# Patient Record
Sex: Female | Born: 1990 | Hispanic: Yes | Marital: Married | Smoking: Never smoker
Health system: Southern US, Community
[De-identification: ages and names within clinical notes are randomized; demographics above are authoritative.]

## PROBLEM LIST (undated history)

## (undated) DIAGNOSIS — Z202 Contact with and (suspected) exposure to infections with a predominantly sexual mode of transmission: Secondary | ICD-10-CM

## (undated) HISTORY — DX: Contact with and (suspected) exposure to infections with a predominantly sexual mode of transmission: Z20.2

---
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
## 2012-07-07 ENCOUNTER — Telehealth: Payer: Self-pay | Admitting: Certified Nurse Midwife

## 2012-07-07 NOTE — Telephone Encounter (Signed)
Pt wants to come in for STD testing and she has not had a cycle

## 2012-07-08 ENCOUNTER — Encounter: Payer: Self-pay | Admitting: Certified Nurse Midwife

## 2012-07-08 NOTE — Telephone Encounter (Signed)
Spoke with pt who would like STD testing, and also wants to discuss no menses since stopping Depo 6 months ago. No current BC. Sched OV with DL 05-16-50 at [id number] per pt request.  aa

## 2012-07-13 ENCOUNTER — Ambulatory Visit (INDEPENDENT_AMBULATORY_CARE_PROVIDER_SITE_OTHER): Payer: BC Managed Care – PPO | Admitting: Certified Nurse Midwife

## 2012-07-13 ENCOUNTER — Encounter: Payer: Self-pay | Admitting: Certified Nurse Midwife

## 2012-07-13 VITALS — BP 90/60 | HR 68 | Resp 16 | Ht <= 58 in | Wt 119.0 lb

## 2012-07-13 DIAGNOSIS — Z113 Encounter for screening for infections with a predominantly sexual mode of transmission: Secondary | ICD-10-CM

## 2012-07-13 DIAGNOSIS — N912 Amenorrhea, unspecified: Secondary | ICD-10-CM

## 2012-07-13 NOTE — Progress Notes (Signed)
  22 y.o.Single Hispanic female presents with no menses since April 05 2012  Currently periods were occurring every month  for  three days.   Bleeding is light.  Periods were not regular in the past, occurring every  3 to 4  months. Patient reports no other health issues . Reports Ortho-Evra, compliant with using but stopped in December.  Has not been sexually active since 11-2011. Feels like period would start but no bleeding.  Worried. Also would like to have STD screening from last partner. Urine pregnancy not done.  Patient denies having been diagnosed with metabolic syndrome or polycystic ovaries.Last aex 01-08-12 normal here.  Mother with patient today for support and questions.  Pertinent items noted in HPI  O: Healthy female WD WN    Weight 119 lb  Height 4 ' Affect: normal orientation x 3 Exam:Thyroid no nodules or enlargement  Abdomen: soft non-tender no masses Lymph: groin no enlargement External genital area: no lesions normal BUS: negative Urethra: non tender Vagina: normal appearance, normal discharge Cervix: Non tender CMT negative Specimens obtained Uterus: normal size mid position, non tender Adnexa: normal, no masses, non tender Perineum: no lesions   A: Amenorrhea  History of irregular cycles Previous history of Ortho Evra use for cycle regulation without problems Normal exam STD screening desired  Plan: Discussed with patient factors that may be contributory to menstrual abnormalities include recent stopping of Ortho Evra, Pituitary or Thyroid changes. Previous treatments for menstrual abnormalities include Provera challenge followed by contraception for cycle regulation. Also using Provera challenge and awaiting normal cycle after use, if all labs are normal. Labs:TSH,Prolactin,HCG Qualatative Questions addressed GC,Chlamydia,RPR,HIV, discussed prevention  RV as indicated      Reviewed, TL

## 2012-07-14 LAB — HIV ANTIBODY (ROUTINE TESTING W REFLEX): HIV: NONREACTIVE

## 2012-07-14 LAB — RPR

## 2012-07-14 LAB — HCG, SERUM, QUALITATIVE: Preg, Serum: NEGATIVE

## 2012-07-15 NOTE — Addendum Note (Signed)
Addended by: Verner Chol on: 07/15/2012 03:27 PM   Modules accepted: Orders

## 2013-01-10 ENCOUNTER — Ambulatory Visit: Payer: Self-pay | Admitting: Certified Nurse Midwife

## 2013-01-10 ENCOUNTER — Encounter: Payer: Self-pay | Admitting: Certified Nurse Midwife

## 2013-11-24 ENCOUNTER — Encounter: Payer: Self-pay | Admitting: Certified Nurse Midwife

## 2013-11-24 ENCOUNTER — Ambulatory Visit (INDEPENDENT_AMBULATORY_CARE_PROVIDER_SITE_OTHER): Payer: BC Managed Care – PPO | Admitting: Certified Nurse Midwife

## 2013-11-24 VITALS — BP 110/72 | HR 68 | Resp 16 | Ht 58.75 in | Wt 115.0 lb

## 2013-11-24 DIAGNOSIS — Z01419 Encounter for gynecological examination (general) (routine) without abnormal findings: Secondary | ICD-10-CM

## 2013-11-24 DIAGNOSIS — Z0000 Encounter for general adult medical examination without abnormal findings: Secondary | ICD-10-CM

## 2013-11-24 DIAGNOSIS — Z124 Encounter for screening for malignant neoplasm of cervix: Secondary | ICD-10-CM

## 2013-11-24 LAB — HEMOGLOBIN, FINGERSTICK: HEMOGLOBIN, FINGERSTICK: 12.1 g/dL (ref 12.0–16.0)

## 2013-11-24 NOTE — Progress Notes (Signed)
23 y.o. G0P0000 Single Hispanic Fe here for annual exam. Periods normal, no issues. Contraception working well. Desires STD screening. Uses urgent care if needed. Recent trip to [place] for vacation! No health issues today.  Patient's last menstrual period was 11/18/2013.          Sexually active: Yes.    The current method of family planning is condoms most of the time.    Exercising: No.  exercise Smoker:  no  Health Maintenance: Pap: none MMG:  none Colonoscopy:  none BMD:   none TDaP:  2011 Labs: Hgb-12.1 Self breast exam: not done   reports that she has never smoked. She does not have any smokeless tobacco history on file. She reports that she drinks about .5 ounces of alcohol per week. She reports that she does not use illicit drugs.  Past Medical History  Diagnosis Date  . Chlamydia contact, treated     7/11, 10/11    History reviewed. No pertinent past surgical history.  Current Outpatient Prescriptions  Medication Sig Dispense Refill  . trimethoprim-polymyxin b (POLYTRIM) ophthalmic solution as needed.       No current facility-administered medications for this visit.    Family History  Problem Relation Age of Onset  . Diabetes Maternal Grandmother     ROS:  Pertinent items are noted in HPI.  Otherwise, a comprehensive ROS was negative.  Exam:   BP 110/72  Pulse 68  Resp 16  Ht 4' 10.75" (1.492 m)  Wt 115 lb (52.164 kg)  BMI 23.43 kg/m2  LMP 11/18/2013 Height: 4' 10.75" (149.2 cm)  Ht Readings from Last 3 Encounters:  11/24/13 4' 10.75" (1.492 m)  07/13/12 4' (1.219 m)    General appearance: alert, cooperative and appears stated age Head: Normocephalic, without obvious abnormality, atraumatic Neck: no adenopathy, supple, symmetrical, trachea midline and thyroid normal to inspection and palpation Lungs: clear to auscultation bilaterally Breasts: normal appearance, no masses or tenderness, No nipple retraction or dimpling, No nipple discharge or  bleeding, No axillary or supraclavicular adenopathy Heart: regular rate and rhythm Abdomen: soft, non-tender; no masses,  no organomegaly Extremities: extremities normal, atraumatic, no cyanosis or edema Skin: Skin color, texture, turgor normal. No rashes or lesions Lymph nodes: Cervical, supraclavicular, and axillary nodes normal. No abnormal inguinal nodes palpated Neurologic: Grossly normal   Pelvic: External genitalia:  no lesions              Urethra:  normal appearing urethra with no masses, tenderness or lesions              Bartholin's and Skene's: normal                 Vagina: normal appearing vagina with normal color and discharge, no lesions              Cervix: non tender, normal,no lesions              Pap taken: Yes.   Bimanual Exam:  Uterus:  normal size, contour, position, consistency, mobility, non-tender and anteverted              Adnexa: normal adnexa and no mass, fullness, tenderness               Rectovaginal: Confirms               Anus:  normal appearance  A:  Well Woman with normal exam  Contraception condoms  STD screening  P:   Reviewed health and wellness  pertinent to exam  Discussed importance of consistent use  Lab:GC,Chlamydia, HIV,RPR  Pap smear taken today with HPV reflex   counseled on breast self exam, STD prevention, HIV risk factors and prevention, adequate intake of calcium and vitamin D, diet and exercise  return annually or prn  An After Visit Summary was printed and given to the patient.

## 2013-11-24 NOTE — Patient Instructions (Signed)
General topics  Next pap or exam is  due in 1 year Take a Women's multivitamin Take 1200 mg. of calcium daily - prefer dietary If any concerns in interim to call back  Breast Self-Awareness Practicing breast self-awareness may pick up problems early, prevent significant medical complications, and possibly save your life. By practicing breast self-awareness, you can become familiar with how your breasts look and feel and if your breasts are changing. This allows you to notice changes early. It can also offer you some reassurance that your breast health is good. One way to learn what is normal for your breasts and whether your breasts are changing is to do a breast self-exam. If you find a lump or something that was not present in the past, it is best to contact your caregiver right away. Other findings that should be evaluated by your caregiver include nipple discharge, especially if it is bloody; skin changes or reddening; areas where the skin seems to be pulled in (retracted); or new lumps and bumps. Breast pain is seldom associated with cancer (malignancy), but should also be evaluated by a caregiver. BREAST SELF-EXAM The best time to examine your breasts is 5 7 days after your menstrual period is over.  ExitCare Patient Information 2013 [employer].   Exercise to Stay Healthy Exercise helps you become and stay healthy. EXERCISE IDEAS AND TIPS Choose exercises that:  You enjoy.  Fit into your day. You do not need to exercise really hard to be healthy. You can do exercises at a slow or medium level and stay healthy. You can:  Stretch before and after working out.  Try yoga, Pilates, or tai chi.  Lift weights.  Walk fast, swim, jog, run, climb stairs, bicycle, dance, or rollerskate.  Take aerobic classes. Exercises that burn about 150 calories:  Running 1  miles in 15 minutes.  Playing volleyball for 45 to 60 minutes.  Washing and waxing a car for 45 to 60  minutes.  Playing touch football for 45 minutes.  Walking 1  miles in 35 minutes.  Pushing a stroller 1  miles in 30 minutes.  Playing basketball for 30 minutes.  Raking leaves for 30 minutes.  Bicycling 5 miles in 30 minutes.  Walking 2 miles in 30 minutes.  Dancing for 30 minutes.  Shoveling snow for 15 minutes.  Swimming laps for 20 minutes.  Walking up stairs for 15 minutes.  Bicycling 4 miles in 15 minutes.  Gardening for 30 to 45 minutes.  Jumping rope for 15 minutes.  Washing windows or floors for 45 to 60 minutes. Document Released: 04/26/2010 Document Revised: 06/16/2011 Document Reviewed: 04/26/2010 ExitCare Patient Information 2013 [employer].   Other topics ( that may be useful information):    Sexually Transmitted Disease Sexually transmitted disease (STD) refers to any infection that is passed from person to person during sexual activity. This may happen by way of saliva, semen, blood, vaginal mucus, or urine. Common STDs include:  Gonorrhea.  Chlamydia.  Syphilis.  HIV/AIDS.  Genital herpes.  Hepatitis B and C.  Trichomonas.  Human papillomavirus (HPV).  Pubic lice. CAUSES  An STD may be spread by bacteria, virus, or parasite. A person can get an STD by:  Sexual intercourse with an infected person.  Sharing sex toys with an infected person.  Sharing needles with an infected person.  Having intimate contact with the genitals, mouth, or rectal areas of an infected person. SYMPTOMS  Some people may not have any symptoms, but   they can still pass the infection to others. Different STDs have different symptoms. Symptoms include:  Painful or bloody urination.  Pain in the pelvis, abdomen, vagina, anus, throat, or eyes.  Skin rash, itching, irritation, growths, or sores (lesions). These usually occur in the genital or anal area.  Abnormal vaginal discharge.  Penile discharge in men.  Soft, flesh-colored skin growths in the  genital or anal area.  Fever.  Pain or bleeding during sexual intercourse.  Swollen glands in the groin area.  Yellow skin and eyes (jaundice). This is seen with hepatitis. DIAGNOSIS  To make a diagnosis, your caregiver may:  Take a medical history.  Perform a physical exam.  Take a specimen (culture) to be examined.  Examine a sample of discharge under a microscope.  Perform blood test TREATMENT   Chlamydia, gonorrhea, trichomonas, and syphilis can be cured with antibiotic medicine.  Genital herpes, hepatitis, and HIV can be treated, but not cured, with prescribed medicines. The medicines will lessen the symptoms.  Genital warts from HPV can be treated with medicine or by freezing, burning (electrocautery), or surgery. Warts may come back.  HPV is a virus and cannot be cured with medicine or surgery.However, abnormal areas may be followed very closely by your caregiver and may be removed from the cervix, vagina, or vulva through office procedures or surgery. If your diagnosis is confirmed, your recent sexual partners need treatment. This is true even if they are symptom-free or have a negative culture or evaluation. They should not have sex until their caregiver says it is okay. HOME CARE INSTRUCTIONS  All sexual partners should be informed, tested, and treated for all STDs.  Take your antibiotics as directed. Finish them even if you start to feel better.  Only take over-the-counter or prescription medicines for pain, discomfort, or fever as directed by your caregiver.  Rest.  Eat a balanced diet and drink enough fluids to keep your urine clear or pale yellow.  Do not have sex until treatment is completed and you have followed up with your caregiver. STDs should be checked after treatment.  Keep all follow-up appointments, Pap tests, and blood tests as directed by your caregiver.  Only use latex condoms and water-soluble lubricants during sexual activity. Do not use  petroleum jelly or oils.  Avoid alcohol and illegal drugs.  Get vaccinated for HPV and hepatitis. If you have not received these vaccines in the past, talk to your caregiver about whether one or both might be right for you.  Avoid risky sex practices that can break the skin. The only way to avoid getting an STD is to avoid all sexual activity.Latex condoms and dental dams (for oral sex) will help lessen the risk of getting an STD, but will not completely eliminate the risk. SEEK MEDICAL CARE IF:   You have a fever.  You have any new or worsening symptoms. Document Released: 06/14/2002 Document Revised: 06/16/2011 Document Reviewed: 06/21/2010 [facility] Patient Information 2013 [employer].    Domestic Abuse You are being battered or abused if someone close to you hits, pushes, or physically hurts you in any way. You also are being abused if you are forced into activities. You are being sexually abused if you are forced to have sexual contact of any kind. You are being emotionally abused if you are made to feel worthless or if you are constantly threatened. It is important to remember that help is available. No one has the right to abuse you. PREVENTION OF FURTHER  ABUSE  Learn the warning signs of danger. This varies with situations but may include: the use of alcohol, threats, isolation from friends and family, or forced sexual contact. Leave if you feel that violence is going to occur.  If you are attacked or beaten, report it to the police so the abuse is documented. You do not have to press charges. The police can protect you while you or the attackers are leaving. Get the officer's name and badge number and a copy of the report.  Find someone you can trust and tell them what is happening to you: your caregiver, a nurse, clergy member, close friend or family member. Feeling ashamed is natural, but remember that you have done nothing wrong. No one deserves abuse. Document Released:  03/21/2000 Document Revised: 06/16/2011 Document Reviewed: 05/30/2010 ExitCare Patient Information 2013 [employer].    How Much is Too Much Alcohol? Drinking too much alcohol can cause injury, accidents, and health problems. These types of problems can include:   Car crashes.  Falls.  Family fighting (domestic violence).  Drowning.  Fights.  Injuries.  Burns.  Damage to certain organs.  Having a baby with birth defects. ONE DRINK CAN BE TOO MUCH WHEN YOU ARE:  Working.  Pregnant or breastfeeding.  Taking medicines. Ask your doctor.  Driving or planning to drive. If you or someone you know has a drinking problem, get help from a doctor.  Document Released: 01/18/2009 Document Revised: 06/16/2011 Document Reviewed: 01/18/2009 ExitCare Patient Information 2013 [employer].   Smoking Hazards Smoking cigarettes is extremely bad for your health. Tobacco smoke has over 200 known poisons in it. There are over 60 chemicals in tobacco smoke that cause cancer. Some of the chemicals found in cigarette smoke include:   Cyanide.  Benzene.  Formaldehyde.  Methanol (wood alcohol).  Acetylene (fuel used in welding torches).  Ammonia. Cigarette smoke also contains the poisonous gases nitrogen oxide and carbon monoxide.  Cigarette smokers have an increased risk of many serious medical problems and Smoking causes approximately:  90% of all lung cancer deaths in men.  80% of all lung cancer deaths in women.  90% of deaths from chronic obstructive lung disease. Compared with nonsmokers, smoking increases the risk of:  Coronary heart disease by 2 to 4 times.  Stroke by 2 to 4 times.  Men developing lung cancer by 23 times.  Women developing lung cancer by 13 times.  Dying from chronic obstructive lung diseases by 12 times.  . Smoking is the most preventable cause of death and disease in our society.  WHY IS SMOKING ADDICTIVE?  Nicotine is the chemical  agent in tobacco that is capable of causing addiction or dependence.  When you smoke and inhale, nicotine is absorbed rapidly into the bloodstream through your lungs. Nicotine absorbed through the lungs is capable of creating a powerful addiction. Both inhaled and non-inhaled nicotine may be addictive.  Addiction studies of cigarettes and spit tobacco show that addiction to nicotine occurs mainly during the teen years, when young people begin using tobacco products. WHAT ARE THE BENEFITS OF QUITTING?  There are many health benefits to quitting smoking.   Likelihood of developing cancer and heart disease decreases. Health improvements are seen almost immediately.  Blood pressure, pulse rate, and breathing patterns start returning to normal soon after quitting. QUITTING SMOKING   American Lung Association - 1-800-LUNGUSA  American Cancer Society - 1-800-ACS-2345 Document Released: 05/01/2004 Document Revised: 06/16/2011 Document Reviewed: 01/03/2009 ExitCare Patient Information 2013 ExitCare[employer]   LL[employer].   Stress Management Stress is a state of physical or mental tension that often results from changes in your life or normal routine. Some common causes of stress are:  Death of a loved one.  Injuries or severe illnesses.  Getting fired or changing jobs.  Moving into a new home. Other causes may be:  Sexual problems.  Business or financial losses.  Taking on a large debt.  Regular conflict with someone at home or at work.  Constant tiredness from lack of sleep. It is not just bad things that are stressful. It may be stressful to:  Win the lottery.  Get married.  Buy a new car. The amount of stress that can be easily tolerated varies from person to person. Changes generally cause stress, regardless of the types of change. Too much stress can affect your health. It may lead to physical or emotional problems. Too little stress (boredom) may also become stressful. SUGGESTIONS TO  REDUCE STRESS:  Talk things over with your family and friends. It often is helpful to share your concerns and worries. If you feel your problem is serious, you may want to get help from a professional counselor.  Consider your problems one at a time instead of lumping them all together. Trying to take care of everything at once may seem impossible. List all the things you need to do and then start with the most important one. Set a goal to accomplish 2 or 3 things each day. If you expect to do too many in a single day you will naturally fail, causing you to feel even more stressed.  Do not use alcohol or drugs to relieve stress. Although you may feel better for a short time, they do not remove the problems that caused the stress. They can also be habit forming.  Exercise regularly - at least 3 times per week. Physical exercise can help to relieve that "uptight" feeling and will relax you.  The shortest distance between despair and hope is often a good night's sleep.  Go to bed and get up on time allowing yourself time for appointments without being rushed.  Take a short "time-out" period from any stressful situation that occurs during the day. Close your eyes and take some deep breaths. Starting with the muscles in your face, tense them, hold it for a few seconds, then relax. Repeat this with the muscles in your neck, shoulders, hand, stomach, back and legs.  Take good care of yourself. Eat a balanced diet and get plenty of rest.  Schedule time for having fun. Take a break from your daily routine to relax. HOME CARE INSTRUCTIONS   Call if you feel overwhelmed by your problems and feel you can no longer manage them on your own.  Return immediately if you feel like hurting yourself or someone else. Document Released: 09/17/2000 Document Revised: 06/16/2011 Document Reviewed: 05/10/2007 ExitCare Patient Information 2013 [employer].   

## 2013-11-24 NOTE — Progress Notes (Signed)
Reviewed personally.  M. Suzanne Ralpheal Zappone, MD.  

## 2013-11-25 LAB — IPS N GONORRHOEA AND CHLAMYDIA BY PCR

## 2013-11-25 LAB — HIV ANTIBODY (ROUTINE TESTING W REFLEX): HIV: NONREACTIVE

## 2013-11-25 LAB — RPR

## 2013-11-28 ENCOUNTER — Telehealth: Payer: Self-pay

## 2013-11-28 LAB — IPS PAP TEST WITH REFLEX TO HPV

## 2013-11-28 NOTE — Telephone Encounter (Signed)
lmtcb

## 2013-11-28 NOTE — Telephone Encounter (Signed)
Message copied by Eliezer Bottom on Mon Nov 28, 2013  5:04 PM ------      Message from: Verner Chol      Created: Sun Nov 27, 2013  2:41 PM       Notify HIV,RPR, GC,Chlamydia are negative ------

## 2013-11-29 NOTE — Telephone Encounter (Signed)
Patient notified of results. See lab 

## 2014-07-19 ENCOUNTER — Telehealth: Payer: Self-pay | Admitting: Certified Nurse Midwife

## 2014-07-19 NOTE — Telephone Encounter (Signed)
LMTCB about canceled appointment with DL °

## 2014-11-21 ENCOUNTER — Telehealth: Payer: Self-pay | Admitting: Certified Nurse Midwife

## 2014-11-21 NOTE — Telephone Encounter (Signed)
Patient would like std testing.

## 2014-11-21 NOTE — Telephone Encounter (Signed)
Spoke with patient. Patient states that she would like STD testing because she has been experiencing a vaginal odor that comes and goes. Denies any vaginal discharge, pelvic pain, fevers, or chills.  Would also like to discuss starting on birth control. Appointment scheduled for tomorrow 11/22/2014 at 11am with Verner Chol CNM. Advised if develops any new symptoms will need to be seen early. Patient is agreeable. Agreeable to date and time.  Routing to provider for final review. Patient agreeable to disposition. Will close encounter.   Patient aware provider will review message and nurse will return call if any additional advice or change of disposition.

## 2014-11-22 ENCOUNTER — Ambulatory Visit (INDEPENDENT_AMBULATORY_CARE_PROVIDER_SITE_OTHER): Payer: BLUE CROSS/BLUE SHIELD | Admitting: Certified Nurse Midwife

## 2014-11-22 ENCOUNTER — Encounter: Payer: Self-pay | Admitting: Certified Nurse Midwife

## 2014-11-22 VITALS — BP 120/76 | HR 72 | Resp 16 | Ht 58.75 in | Wt 124.0 lb

## 2014-11-22 DIAGNOSIS — R3915 Urgency of urination: Secondary | ICD-10-CM

## 2014-11-22 DIAGNOSIS — Z113 Encounter for screening for infections with a predominantly sexual mode of transmission: Secondary | ICD-10-CM

## 2014-11-22 DIAGNOSIS — Z3009 Encounter for other general counseling and advice on contraception: Secondary | ICD-10-CM

## 2014-11-22 DIAGNOSIS — N9489 Other specified conditions associated with female genital organs and menstrual cycle: Secondary | ICD-10-CM

## 2014-11-22 DIAGNOSIS — N898 Other specified noninflammatory disorders of vagina: Secondary | ICD-10-CM

## 2014-11-22 NOTE — Progress Notes (Signed)
24 y.o.Single hispanic g0p0 here with complaint of urinary urgency( which has improved) and vaginal odor.. Patient denies fever, chills, nausea or back pain or pain with urination. No new personal products. Patient feels not related to sexual activity. Denies any vaginal symptoms of itching or burning.  Feels the problem is vaginal, not urinary.  Contraception is condoms most of the time.. Patient consuming adequate water intake. Patient also desires .STD screening. No partner change. Patient also interested in Nexplanon for contraception.   Urine POCT negative  O: Healthy female WDWN Affect: Normal, orientation x 3 Skin : warm and dry CVAT: negative bilateral Abdomen: negative for suprapubic tenderness  Pelvic exam: External genital area: normal, no lesions Bladder,Urethra non tender, Urethral meatus: non tender, not red Vagina: clear watery odorous vaginal discharge, normal appearance  Affirm taken.  Cervix: normal, non tender Uterus:normal,non tender Adnexa: normal non tender, no fullness or masses   A:Normal pelvic exam Vaginal odor ? BV STD screening Contraception management desires information on Nexplanon, no contraindication for use  P: Reviewed findings of normal pelvic exam. Discussed finding of odor and will treat once affirm results in am. Lab:GC, Chlamydia, STD panel, affirm Discussed risks and benefits of Nexplanon, insertion, removal and expectations with menses. Shown device and insertion site on arm. Discussed would need to be inserted on day 1-5 of menses and has to have used consistent condoms for contraception prior to insertion or will not be done. Patient voiced understanding and would like to try Nexplanon. Given brochure and questions answered at length. Patient aware she will be called with insurance infor and she will call on menses in 9/16 for insertion. Reviewed warning signs and symptoms of UTI and need to advise if occurring.Encouraged to limit soda, tea, and  coffee and increase water intake.   RV prn/ and as above  15 minutes spent in consultation regarding Nexplanon.

## 2014-11-22 NOTE — Progress Notes (Signed)
Reviewed personally.  M. Suzanne Burrel Legrand, MD.  

## 2014-11-22 NOTE — Patient Instructions (Signed)
Etonogestrel implant What is this medicine? ETONOGESTREL (et oh noe JES trel) is a contraceptive (birth control) device. It is used to prevent pregnancy. It can be used for up to 3 years. This medicine may be used for other purposes; ask your health care provider or pharmacist if you have questions. COMMON BRAND NAME(S): Implanon, Nexplanon What should I tell my health care provider before I take this medicine? They need to know if you have any of these conditions: -abnormal vaginal bleeding -blood vessel disease or blood clots -cancer of the breast, cervix, or liver -depression -diabetes -gallbladder disease -headaches -heart disease or recent heart attack -high blood pressure -high cholesterol -kidney disease -liver disease -renal disease -seizures -tobacco smoker -an unusual or allergic reaction to etonogestrel, other hormones, anesthetics or antiseptics, medicines, foods, dyes, or preservatives -pregnant or trying to get pregnant -breast-feeding How should I use this medicine? This device is inserted just under the skin on the inner side of your upper arm by a health care professional. Talk to your pediatrician regarding the use of this medicine in children. Special care may be needed. Overdosage: If you think you've taken too much of this medicine contact a poison control center or emergency room at once. Overdosage: If you think you have taken too much of this medicine contact a poison control center or emergency room at once. NOTE: This medicine is only for you. Do not share this medicine with others. What if I miss a dose? This does not apply. What may interact with this medicine? Do not take this medicine with any of the following medications: -amprenavir -bosentan -fosamprenavir This medicine may also interact with the following medications: -barbiturate medicines for inducing sleep or treating seizures -certain medicines for fungal infections like ketoconazole and  itraconazole -griseofulvin -medicines to treat seizures like carbamazepine, felbamate, oxcarbazepine, phenytoin, topiramate -modafinil -phenylbutazone -rifampin -some medicines to treat HIV infection like atazanavir, indinavir, lopinavir, nelfinavir, tipranavir, ritonavir -St. John's wort This list may not describe all possible interactions. Give your health care provider a list of all the medicines, herbs, non-prescription drugs, or dietary supplements you use. Also tell them if you smoke, drink alcohol, or use illegal drugs. Some items may interact with your medicine. What should I watch for while using this medicine? This product does not protect you against HIV infection (AIDS) or other sexually transmitted diseases. You should be able to feel the implant by pressing your fingertips over the skin where it was inserted. Tell your doctor if you cannot feel the implant. What side effects may I notice from receiving this medicine? Side effects that you should report to your doctor or health care professional as soon as possible: -allergic reactions like skin rash, itching or hives, swelling of the face, lips, or tongue -breast lumps -changes in vision -confusion, trouble speaking or understanding -dark urine -depressed mood -general ill feeling or flu-like symptoms -light-colored stools -loss of appetite, nausea -right upper belly pain -severe headaches -severe pain, swelling, or tenderness in the abdomen -shortness of breath, chest pain, swelling in a leg -signs of pregnancy -sudden numbness or weakness of the face, arm or leg -trouble walking, dizziness, loss of balance or coordination -unusual vaginal bleeding, discharge -unusually weak or tired -yellowing of the eyes or skin Side effects that usually do not require medical attention (Report these to your doctor or health care professional if they continue or are bothersome.): -acne -breast pain -changes in  weight -cough -fever or chills -headache -irregular menstrual bleeding -itching, burning, and   vaginal discharge -pain or difficulty passing urine -sore throat This list may not describe all possible side effects. Call your doctor for medical advice about side effects. You may report side effects to FDA at 1-800-FDA-1088. Where should I keep my medicine? This drug is given in a hospital or clinic and will not be stored at home. NOTE: This sheet is a summary. It may not cover all possible information. If you have questions about this medicine, talk to your doctor, pharmacist, or health care provider.  2015, Elsevier/Gold Standard. (2011-09-29 15:37:45)  

## 2014-11-23 ENCOUNTER — Other Ambulatory Visit: Payer: Self-pay | Admitting: Certified Nurse Midwife

## 2014-11-23 DIAGNOSIS — B9689 Other specified bacterial agents as the cause of diseases classified elsewhere: Secondary | ICD-10-CM

## 2014-11-23 DIAGNOSIS — N760 Acute vaginitis: Principal | ICD-10-CM

## 2014-11-23 LAB — STD PANEL
HIV: NONREACTIVE
Hepatitis B Surface Ag: NEGATIVE

## 2014-11-23 LAB — WET PREP BY MOLECULAR PROBE
Candida species: NEGATIVE
GARDNERELLA VAGINALIS: POSITIVE — AB
TRICHOMONAS VAG: NEGATIVE

## 2014-11-23 MED ORDER — HYLAFEM VA SUPP: 1.0000 | Freq: Every day | VAGINAL | Status: DC

## 2014-11-24 LAB — IPS N GONORRHOEA AND CHLAMYDIA BY PCR

## 2014-11-27 ENCOUNTER — Ambulatory Visit: Payer: BC Managed Care – PPO | Admitting: Certified Nurse Midwife

## 2014-12-01 ENCOUNTER — Telehealth: Payer: Self-pay | Admitting: Certified Nurse Midwife

## 2014-12-01 NOTE — Telephone Encounter (Signed)
Call to patient to discuss benefits for procedure. Left voicemail to return my call.

## 2014-12-05 ENCOUNTER — Ambulatory Visit: Payer: Self-pay | Admitting: Certified Nurse Midwife

## 2014-12-05 ENCOUNTER — Encounter: Payer: Self-pay | Admitting: Certified Nurse Midwife

## 2014-12-06 NOTE — Telephone Encounter (Signed)
Call to patient to review benefit information for Nexplanon insertion. Left message for patient to return call.

## 2014-12-13 NOTE — Telephone Encounter (Signed)
Third call to patient to review benefits for Nexplanon insertion. Left voicemail for patient to return call to discuss.  Routing to provider for final review. Will close encounter.     cc. Dr Hyacinth Meeker

## 2015-02-07 ENCOUNTER — Encounter: Payer: Self-pay | Admitting: Certified Nurse Midwife

## 2015-02-07 ENCOUNTER — Ambulatory Visit (INDEPENDENT_AMBULATORY_CARE_PROVIDER_SITE_OTHER): Payer: BLUE CROSS/BLUE SHIELD | Admitting: Certified Nurse Midwife

## 2015-02-07 VITALS — BP 100/64 | HR 68 | Resp 16 | Ht 58.75 in | Wt 121.0 lb

## 2015-02-07 DIAGNOSIS — Z0000 Encounter for general adult medical examination without abnormal findings: Secondary | ICD-10-CM | POA: Diagnosis not present

## 2015-02-07 DIAGNOSIS — N9489 Other specified conditions associated with female genital organs and menstrual cycle: Secondary | ICD-10-CM

## 2015-02-07 DIAGNOSIS — N898 Other specified noninflammatory disorders of vagina: Secondary | ICD-10-CM

## 2015-02-07 DIAGNOSIS — Z01419 Encounter for gynecological examination (general) (routine) without abnormal findings: Secondary | ICD-10-CM

## 2015-02-07 LAB — POCT URINALYSIS DIPSTICK
Bilirubin, UA: NEGATIVE
Blood, UA: NEGATIVE
GLUCOSE UA: NEGATIVE
Ketones, UA: NEGATIVE
Nitrite, UA: NEGATIVE
PROTEIN UA: NEGATIVE
UROBILINOGEN UA: NEGATIVE
pH, UA: 5

## 2015-02-07 NOTE — Patient Instructions (Signed)
General topics  Next pap or exam is  due in 1 year Take a Women's multivitamin Take 1200 mg. of calcium daily - prefer dietary If any concerns in interim to call back  Breast Self-Awareness Practicing breast self-awareness may pick up problems early, prevent significant medical complications, and possibly save your life. By practicing breast self-awareness, you can become familiar with how your breasts look and feel and if your breasts are changing. This allows you to notice changes early. It can also offer you some reassurance that your breast health is good. One way to learn what is normal for your breasts and whether your breasts are changing is to do a breast self-exam. If you find a lump or something that was not present in the past, it is best to contact your caregiver right away. Other findings that should be evaluated by your caregiver include nipple discharge, especially if it is bloody; skin changes or reddening; areas where the skin seems to be pulled in (retracted); or new lumps and bumps. Breast pain is seldom associated with cancer (malignancy), but should also be evaluated by a caregiver. BREAST SELF-EXAM The best time to examine your breasts is 5 7 days after your menstrual period is over.  ExitCare Patient Information 2013 [employer].   Exercise to Stay Healthy Exercise helps you become and stay healthy. EXERCISE IDEAS AND TIPS Choose exercises that:  You enjoy.  Fit into your day. You do not need to exercise really hard to be healthy. You can do exercises at a slow or medium level and stay healthy. You can:  Stretch before and after working out.  Try yoga, Pilates, or tai chi.  Lift weights.  Walk fast, swim, jog, run, climb stairs, bicycle, dance, or rollerskate.  Take aerobic classes. Exercises that burn about 150 calories:  Running 1  miles in 15 minutes.  Playing volleyball for 45 to 60 minutes.  Washing and waxing a car for 45 to 60  minutes.  Playing touch football for 45 minutes.  Walking 1  miles in 35 minutes.  Pushing a stroller 1  miles in 30 minutes.  Playing basketball for 30 minutes.  Raking leaves for 30 minutes.  Bicycling 5 miles in 30 minutes.  Walking 2 miles in 30 minutes.  Dancing for 30 minutes.  Shoveling snow for 15 minutes.  Swimming laps for 20 minutes.  Walking up stairs for 15 minutes.  Bicycling 4 miles in 15 minutes.  Gardening for 30 to 45 minutes.  Jumping rope for 15 minutes.  Washing windows or floors for 45 to 60 minutes. Document Released: 04/26/2010 Document Revised: 06/16/2011 Document Reviewed: 04/26/2010 ExitCare Patient Information 2013 [employer].   Other topics ( that may be useful information):    Sexually Transmitted Disease Sexually transmitted disease (STD) refers to any infection that is passed from person to person during sexual activity. This may happen by way of saliva, semen, blood, vaginal mucus, or urine. Common STDs include:  Gonorrhea.  Chlamydia.  Syphilis.  HIV/AIDS.  Genital herpes.  Hepatitis B and C.  Trichomonas.  Human papillomavirus (HPV).  Pubic lice. CAUSES  An STD may be spread by bacteria, virus, or parasite. A person can get an STD by:  Sexual intercourse with an infected person.  Sharing sex toys with an infected person.  Sharing needles with an infected person.  Having intimate contact with the genitals, mouth, or rectal areas of an infected person. SYMPTOMS  Some people may not have any symptoms, but   they can still pass the infection to others. Different STDs have different symptoms. Symptoms include:  Painful or bloody urination.  Pain in the pelvis, abdomen, vagina, anus, throat, or eyes.  Skin rash, itching, irritation, growths, or sores (lesions). These usually occur in the genital or anal area.  Abnormal vaginal discharge.  Penile discharge in men.  Soft, flesh-colored skin growths in the  genital or anal area.  Fever.  Pain or bleeding during sexual intercourse.  Swollen glands in the groin area.  Yellow skin and eyes (jaundice). This is seen with hepatitis. DIAGNOSIS  To make a diagnosis, your caregiver may:  Take a medical history.  Perform a physical exam.  Take a specimen (culture) to be examined.  Examine a sample of discharge under a microscope.  Perform blood test TREATMENT   Chlamydia, gonorrhea, trichomonas, and syphilis can be cured with antibiotic medicine.  Genital herpes, hepatitis, and HIV can be treated, but not cured, with prescribed medicines. The medicines will lessen the symptoms.  Genital warts from HPV can be treated with medicine or by freezing, burning (electrocautery), or surgery. Warts may come back.  HPV is a virus and cannot be cured with medicine or surgery.However, abnormal areas may be followed very closely by your caregiver and may be removed from the cervix, vagina, or vulva through office procedures or surgery. If your diagnosis is confirmed, your recent sexual partners need treatment. This is true even if they are symptom-free or have a negative culture or evaluation. They should not have sex until their caregiver says it is okay. HOME CARE INSTRUCTIONS  All sexual partners should be informed, tested, and treated for all STDs.  Take your antibiotics as directed. Finish them even if you start to feel better.  Only take over-the-counter or prescription medicines for pain, discomfort, or fever as directed by your caregiver.  Rest.  Eat a balanced diet and drink enough fluids to keep your urine clear or pale yellow.  Do not have sex until treatment is completed and you have followed up with your caregiver. STDs should be checked after treatment.  Keep all follow-up appointments, Pap tests, and blood tests as directed by your caregiver.  Only use latex condoms and water-soluble lubricants during sexual activity. Do not use  petroleum jelly or oils.  Avoid alcohol and illegal drugs.  Get vaccinated for HPV and hepatitis. If you have not received these vaccines in the past, talk to your caregiver about whether one or both might be right for you.  Avoid risky sex practices that can break the skin. The only way to avoid getting an STD is to avoid all sexual activity.Latex condoms and dental dams (for oral sex) will help lessen the risk of getting an STD, but will not completely eliminate the risk. SEEK MEDICAL CARE IF:   You have a fever.  You have any new or worsening symptoms. Document Released: 06/14/2002 Document Revised: 06/16/2011 Document Reviewed: 06/21/2010 [facility] Patient Information 2013 [employer].    Domestic Abuse You are being battered or abused if someone close to you hits, pushes, or physically hurts you in any way. You also are being abused if you are forced into activities. You are being sexually abused if you are forced to have sexual contact of any kind. You are being emotionally abused if you are made to feel worthless or if you are constantly threatened. It is important to remember that help is available. No one has the right to abuse you. PREVENTION OF FURTHER  ABUSE  Learn the warning signs of danger. This varies with situations but may include: the use of alcohol, threats, isolation from friends and family, or forced sexual contact. Leave if you feel that violence is going to occur.  If you are attacked or beaten, report it to the police so the abuse is documented. You do not have to press charges. The police can protect you while you or the attackers are leaving. Get the officer's name and badge number and a copy of the report.  Find someone you can trust and tell them what is happening to you: your caregiver, a nurse, clergy member, close friend or family member. Feeling ashamed is natural, but remember that you have done nothing wrong. No one deserves abuse. Document Released:  03/21/2000 Document Revised: 06/16/2011 Document Reviewed: 05/30/2010 ExitCare Patient Information 2013 [employer].    How Much is Too Much Alcohol? Drinking too much alcohol can cause injury, accidents, and health problems. These types of problems can include:   Car crashes.  Falls.  Family fighting (domestic violence).  Drowning.  Fights.  Injuries.  Burns.  Damage to certain organs.  Having a baby with birth defects. ONE DRINK CAN BE TOO MUCH WHEN YOU ARE:  Working.  Pregnant or breastfeeding.  Taking medicines. Ask your doctor.  Driving or planning to drive. If you or someone you know has a drinking problem, get help from a doctor.  Document Released: 01/18/2009 Document Revised: 06/16/2011 Document Reviewed: 01/18/2009 ExitCare Patient Information 2013 [employer].   Smoking Hazards Smoking cigarettes is extremely bad for your health. Tobacco smoke has over 200 known poisons in it. There are over 60 chemicals in tobacco smoke that cause cancer. Some of the chemicals found in cigarette smoke include:   Cyanide.  Benzene.  Formaldehyde.  Methanol (wood alcohol).  Acetylene (fuel used in welding torches).  Ammonia. Cigarette smoke also contains the poisonous gases nitrogen oxide and carbon monoxide.  Cigarette smokers have an increased risk of many serious medical problems and Smoking causes approximately:  90% of all lung cancer deaths in men.  80% of all lung cancer deaths in women.  90% of deaths from chronic obstructive lung disease. Compared with nonsmokers, smoking increases the risk of:  Coronary heart disease by 2 to 4 times.  Stroke by 2 to 4 times.  Men developing lung cancer by 23 times.  Women developing lung cancer by 13 times.  Dying from chronic obstructive lung diseases by 12 times.  . Smoking is the most preventable cause of death and disease in our society.  WHY IS SMOKING ADDICTIVE?  Nicotine is the chemical  agent in tobacco that is capable of causing addiction or dependence.  When you smoke and inhale, nicotine is absorbed rapidly into the bloodstream through your lungs. Nicotine absorbed through the lungs is capable of creating a powerful addiction. Both inhaled and non-inhaled nicotine may be addictive.  Addiction studies of cigarettes and spit tobacco show that addiction to nicotine occurs mainly during the teen years, when young people begin using tobacco products. WHAT ARE THE BENEFITS OF QUITTING?  There are many health benefits to quitting smoking.   Likelihood of developing cancer and heart disease decreases. Health improvements are seen almost immediately.  Blood pressure, pulse rate, and breathing patterns start returning to normal soon after quitting. QUITTING SMOKING   American Lung Association - 1-800-LUNGUSA  American Cancer Society - 1-800-ACS-2345 Document Released: 05/01/2004 Document Revised: 06/16/2011 Document Reviewed: 01/03/2009 ExitCare Patient Information 2013 ExitCare[employer]   LL[employer].   Stress Management Stress is a state of physical or mental tension that often results from changes in your life or normal routine. Some common causes of stress are:  Death of a loved one.  Injuries or severe illnesses.  Getting fired or changing jobs.  Moving into a new home. Other causes may be:  Sexual problems.  Business or financial losses.  Taking on a large debt.  Regular conflict with someone at home or at work.  Constant tiredness from lack of sleep. It is not just bad things that are stressful. It may be stressful to:  Win the lottery.  Get married.  Buy a new car. The amount of stress that can be easily tolerated varies from person to person. Changes generally cause stress, regardless of the types of change. Too much stress can affect your health. It may lead to physical or emotional problems. Too little stress (boredom) may also become stressful. SUGGESTIONS TO  REDUCE STRESS:  Talk things over with your family and friends. It often is helpful to share your concerns and worries. If you feel your problem is serious, you may want to get help from a professional counselor.  Consider your problems one at a time instead of lumping them all together. Trying to take care of everything at once may seem impossible. List all the things you need to do and then start with the most important one. Set a goal to accomplish 2 or 3 things each day. If you expect to do too many in a single day you will naturally fail, causing you to feel even more stressed.  Do not use alcohol or drugs to relieve stress. Although you may feel better for a short time, they do not remove the problems that caused the stress. They can also be habit forming.  Exercise regularly - at least 3 times per week. Physical exercise can help to relieve that "uptight" feeling and will relax you.  The shortest distance between despair and hope is often a good night's sleep.  Go to bed and get up on time allowing yourself time for appointments without being rushed.  Take a short "time-out" period from any stressful situation that occurs during the day. Close your eyes and take some deep breaths. Starting with the muscles in your face, tense them, hold it for a few seconds, then relax. Repeat this with the muscles in your neck, shoulders, hand, stomach, back and legs.  Take good care of yourself. Eat a balanced diet and get plenty of rest.  Schedule time for having fun. Take a break from your daily routine to relax. HOME CARE INSTRUCTIONS   Call if you feel overwhelmed by your problems and feel you can no longer manage them on your own.  Return immediately if you feel like hurting yourself or someone else. Document Released: 09/17/2000 Document Revised: 06/16/2011 Document Reviewed: 05/10/2007 ExitCare Patient Information 2013 [employer].   

## 2015-02-07 NOTE — Progress Notes (Signed)
24 y.o. G0P0000 Single  Hispanic Fe here for annual exam. Periods normal, no issues.Condoms working well for contraception.  No STD screening needed. 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 Complaining of vaginal odor after wearing thongs for certain outfits, no vaginal itching or burning or increase in discharge. Please check to see no BV present again. No new personal products. No urinary symptoms.No other health issues today.  Patient's last menstrual period was 01/28/2015.          Sexually active: Yes.    The current method of family planning is condoms all the time.    Exercising: Yes.    walking Smoker:  no  Health Maintenance: Pap: 11-24-13 neg MMG:  none Colonoscopy:  none BMD:   none TDaP: 2011 Labs: poct urine-wbc tr Self breast exam: not done   reports that she has never smoked. She does not have any smokeless tobacco history on file. She reports that she drinks alcohol. She reports that she does not use illicit drugs.  Past Medical History  Diagnosis Date  . Chlamydia contact, treated     7/11, 10/11    History reviewed. No pertinent past surgical history.  No current outpatient prescriptions on file.   No current facility-administered medications for this visit.    Family History  Problem Relation Age of Onset  . Diabetes Maternal Grandmother     ROS:  Pertinent items are noted in HPI.  Otherwise, a comprehensive ROS was negative.  Exam:   BP 100/64 mmHg  Pulse 68  Resp 16  Ht 4' 10.75" (1.492 m)  Wt 121 lb (54.885 kg)  BMI 24.66 kg/m2  LMP 01/28/2015 Height: 4' 10.75" (149.2 cm) Ht Readings from Last 3 Encounters:  02/07/15 4' 10.75" (1.492 m)  11/22/14 4' 10.75" (1.492 m)  11/24/13 4' 10.75" (1.492 m)    General appearance: alert, cooperative and appears stated age Head: Normocephalic, without obvious abnormality, atraumatic Neck: no adenopathy, supple, symmetrical, trachea midline and thyroid normal to inspection and palpation Lungs:  clear to auscultation bilaterally Breasts: normal appearance, no masses or tenderness, No nipple retraction or dimpling, No nipple discharge or bleeding, No axillary or supraclavicular adenopathy Heart: regular rate and rhythm Abdomen: soft, non-tender; no masses,  no organomegaly Extremities: extremities normal, atraumatic, no cyanosis or edema Skin: Skin color, texture, turgor normal. No rashes or lesions Lymph nodes: Cervical, supraclavicular, and axillary nodes normal. No abnormal inguinal nodes palpated Neurologic: Grossly normal   Pelvic: External genitalia:  no lesions              Urethra:  normal appearing urethra with no masses, tenderness or lesions              Bartholin's and Skene's: normal                 Vagina: normal appearing vagina with normal color and discharge, no lesions, no odor noted  Affirm taken              Cervix: normal non tender, no lesions              Pap taken: No. Bimanual Exam:  Uterus:  normal size, contour, position, consistency, mobility, non-tender              Adnexa: normal adnexa and no mass, fullness, tenderness              Rectovaginal: Confirms  Anus:  Normal appearance  Chaperone present: yes  A:  Well Woman with normal exam  Contraception condoms  Vaginal odor  P:   Reviewed health and wellness pertinent to exam  Stressed continued condom use.  Will treat per affirm if indicated  Pap smear as above not taken   counseled on breast self exam, STD prevention, HIV risk factors and prevention, adequate intake of calcium and vitamin D, diet and exercise  return annually or prn  An After Visit Summary was printed and given to the patient.

## 2015-02-08 ENCOUNTER — Other Ambulatory Visit: Payer: Self-pay | Admitting: Certified Nurse Midwife

## 2015-02-08 ENCOUNTER — Telehealth: Payer: Self-pay | Admitting: *Deleted

## 2015-02-08 DIAGNOSIS — N760 Acute vaginitis: Principal | ICD-10-CM

## 2015-02-08 DIAGNOSIS — B9689 Other specified bacterial agents as the cause of diseases classified elsewhere: Secondary | ICD-10-CM

## 2015-02-08 LAB — WET PREP BY MOLECULAR PROBE
CANDIDA SPECIES: NEGATIVE
Gardnerella vaginalis: POSITIVE — AB
TRICHOMONAS VAG: NEGATIVE

## 2015-02-08 MED ORDER — HYLAFEM VA SUPP: 1.0000 | Freq: Every day | VAGINAL | Status: DC

## 2015-02-08 NOTE — Telephone Encounter (Signed)
-----   Message from Verner Choleborah S Leonard, CNM sent at 02/08/2015  7:27 AM EDT ----- Notify patient that affirm was positive for BV Rx Hylafem order in Yeast and Trichomonas negative

## 2015-02-08 NOTE — Telephone Encounter (Signed)
Patient notified

## 2015-02-08 NOTE — Telephone Encounter (Signed)
Return call to RiversStephanie. Patient says [facility] ws [facility]

## 2015-02-08 NOTE — Telephone Encounter (Signed)
I have attempted to contact this patient by phone with the following results: left message to return call to AustwellStephanie at [phone] on answering machine ([facility]  No personal information given. [phone] (Mobile) *Preferred*

## 2015-02-11 NOTE — Progress Notes (Signed)
Encounter reviewed Juddson Cobern, MD   

## 2015-07-02 ENCOUNTER — Ambulatory Visit (INDEPENDENT_AMBULATORY_CARE_PROVIDER_SITE_OTHER): Payer: 59 | Admitting: Obstetrics and Gynecology

## 2015-07-02 ENCOUNTER — Encounter: Payer: Self-pay | Admitting: Obstetrics and Gynecology

## 2015-07-02 VITALS — BP 102/70 | HR 80 | Resp 14 | Wt 130.0 lb

## 2015-07-02 DIAGNOSIS — N760 Acute vaginitis: Secondary | ICD-10-CM

## 2015-07-02 DIAGNOSIS — A499 Bacterial infection, unspecified: Secondary | ICD-10-CM

## 2015-07-02 DIAGNOSIS — B9689 Other specified bacterial agents as the cause of diseases classified elsewhere: Secondary | ICD-10-CM

## 2015-07-02 MED ORDER — METRONIDAZOLE 500 MG PO TABS: 500.0000 mg | ORAL_TABLET | Freq: Two times a day (BID) | ORAL | Status: DC

## 2015-07-02 NOTE — Patient Instructions (Signed)

## 2015-07-02 NOTE — Progress Notes (Signed)
Patient ID: Patricia Montgomery, female   DOB: 1990/12/23, 25 y.o.   MRN: [id number] GYNECOLOGY  VISIT   HPI: 25 y.o.   Single  Hispanic  female   G0P0000 with Patient's last menstrual period was 06/21/2015.   here c/o vaginal discharge and odor X2 weeks. The discharge is white to clear, underwear is wet. Vaginal odor is mild, not fishy, but strange. No itching or irritation. Her vaginal symptoms started after being in a hot tub. Some burning with intercourse of with voiding after intercourse. Same partner x 1 year, not preventing pregnancy, not trying to get pregnant.   GYNECOLOGIC HISTORY: Patient's last menstrual period was 06/21/2015. Contraception:none Menopausal hormone therapy: none        OB History    Gravida Para Term Preterm AB TAB SAB Ectopic Multiple Living   0 0 0 0 0 0 0 0 0 0          There are no active problems to display for this patient.   Past Medical History  Diagnosis Date  . Chlamydia contact, treated     7/11, 10/11    History reviewed. No pertinent past surgical history.  No current outpatient prescriptions on file.   No current facility-administered medications for this visit.     ALLERGIES: Review of patient's allergies indicates no known allergies.  Family History  Problem Relation Age of Onset  . Diabetes Maternal Grandmother     Social History   Social History  . Marital Status: Single    Spouse Name: N/A  . Number of Children: N/A  . Years of Education: N/A   Occupational History  . Not on file.   Social History Main Topics  . Smoking status: Never Smoker   . Smokeless tobacco: Never Used  . Alcohol Use: 0.0 oz/week    0 Standard drinks or equivalent per week  . Drug Use: No  . Sexual Activity:    Partners: Male    Birth Control/ Protection: Condom   Other Topics Concern  . Not on file   Social History Narrative    Review of Systems  Constitutional: Negative.   HENT: Negative.   Eyes: Negative.   Respiratory:  Negative.   Cardiovascular: Negative.   Gastrointestinal: Negative.   Genitourinary:       Vaginal discharge and odor  Musculoskeletal: Negative.   Skin: Negative.   Neurological: Negative.   Endo/Heme/Allergies: Negative.   Psychiatric/Behavioral: Negative.     PHYSICAL EXAMINATION:    BP 102/70 mmHg  Pulse 80  Resp 14  Wt 130 lb (58.968 kg)  LMP 06/21/2015    General appearance: alert, cooperative and appears stated age  Pelvic: External genitalia:  no lesions              Urethra:  normal appearing urethra with no masses, tenderness or lesions              Bartholins and Skenes: normal                 Vagina: erythematous, increased watery, white, frothy d/c with an odor              Cervix: no lesions  Chaperone was present for exam.  ASSESSMENT Bacterial vaginitis   PLAN Treat with flagyl, no ETOH   An After Visit Summary was printed and given to the patient.

## 2015-12-17 ENCOUNTER — Ambulatory Visit (INDEPENDENT_AMBULATORY_CARE_PROVIDER_SITE_OTHER): Payer: 59 | Admitting: Obstetrics and Gynecology

## 2015-12-17 ENCOUNTER — Telehealth: Payer: Self-pay | Admitting: Certified Nurse Midwife

## 2015-12-17 ENCOUNTER — Encounter: Payer: Self-pay | Admitting: Obstetrics and Gynecology

## 2015-12-17 VITALS — BP 118/70 | HR 78 | Resp 20 | Ht 58.75 in | Wt 128.4 lb

## 2015-12-17 DIAGNOSIS — N941 Unspecified dyspareunia: Secondary | ICD-10-CM

## 2015-12-17 DIAGNOSIS — R102 Pelvic and perineal pain: Secondary | ICD-10-CM | POA: Diagnosis not present

## 2015-12-17 LAB — POCT URINE PREGNANCY: PREG TEST UR: NEGATIVE

## 2015-12-17 MED ORDER — OXYCODONE-ACETAMINOPHEN 5-325 MG PO TABS: 1.0000 | ORAL_TABLET | ORAL | 0 refills | Status: DC | PRN

## 2015-12-17 NOTE — Telephone Encounter (Signed)
Spoke with patient. Patient states that she had intercourse yesterday and began to have "cervical" pain immediately after that lasted for 2 hours. Reports she became really hot and threw up due to pain. Pain is intermittent today and is a 7/10. Reports she took Ibuprofen yesterday with little relief. Has not taken any medication today. Patient is nauseated today, but has not have vomiting. Denies fever, chills, vaginal discharge, or vaginal bleeding. Advised patient she will need to be seen in the office today for further evaluation. Patient is agreeable. Requesting to see Dr.Jertson as Leota Sauerseborah Leonard CNM is out of the office today. Appointment scheduled for today at 4 pm with Dr.Jertson. Patient declines earlier appointment due to work schedule.  Routing to provider for final review. Patient agreeable to disposition. Will close encounter.

## 2015-12-17 NOTE — Telephone Encounter (Signed)
Patient called and said, "Yesterday I had really bad cervical pain. It was so bad I threw up."  Last seen 07/02/15

## 2015-12-17 NOTE — Progress Notes (Signed)
GYNECOLOGY  VISIT   HPI: 25 y.o.   Single  Hispanic  female   G0P0000 with LMP 11/18/15.    here for pelvic pain. Yesterday during intercourse she had some deep dyspareunia. As soon as intercourse stopped her pain became severe.  The pain is stabbing, in the BLQ (R>L),  worse than 10/10. Pain lasted constantly x 2 hours and had associated nausea and emesis. Since then she has had intermittent stabbing pain diffusely in her abdomen. Today the pain lasts for a few seconds at a time, up to an 8/10 in severity. She has been to function with difficulty. She has taken 800 mg of ibuprofen with minimal help. No bleeding. No bowel or bladder c/o today.  Not using contraception, not actively trying, he is using w/d.  This pain has happened in past, but not as bad.  Menses q month. She is due for her cycle.  GYNECOLOGIC HISTORY: Patient's last menstrual period was 11/18/2015. Contraception:none Menopausal hormone therapy: none        OB History    Gravida Para Term Preterm AB Living   0 0 0 0 0 0   SAB TAB Ectopic Multiple Live Births   0 0 0 0           There are no active problems to display for this patient.   Past Medical History:  Diagnosis Date  . Chlamydia contact, treated    7/11, 10/11    No past surgical history on file.  No current outpatient prescriptions on file.   No current facility-administered medications for this visit.      ALLERGIES: Review of patient's allergies indicates no known allergies.  Family History  Problem Relation Age of Onset  . Diabetes Maternal Grandmother     Social History   Social History  . Marital status: Single    Spouse name: N/A  . Number of children: N/A  . Years of education: N/A   Occupational History  . Not on file.   Social History Main Topics  . Smoking status: Never Smoker  . Smokeless tobacco: Never Used  . Alcohol use 0.0 oz/week  . Drug use: No  . Sexual activity: Yes    Partners: Male    Birth control/  protection: Condom   Other Topics Concern  . Not on file   Social History Narrative  . No narrative on file    Review of Systems  Constitutional: Negative.   HENT: Negative.   Eyes: Negative.   Respiratory: Negative.   Cardiovascular: Negative.   Gastrointestinal: Negative.   Genitourinary:       Dyspareunia   Musculoskeletal: Negative.   Skin: Negative.   Neurological: Negative.   Endo/Heme/Allergies: Negative.   Psychiatric/Behavioral: Negative.     PHYSICAL EXAMINATION:    BP 118/70 (BP Location: Right Arm, Patient Position: Sitting, Cuff Size: Normal)   Pulse 78   Resp 20   Ht 4' 10.75" (1.492 m)   Wt 128 lb 6.4 oz (58.2 kg)   LMP 11/18/2015   BMI 26.15 kg/m     General appearance: alert, cooperative and appears stated age Abdomen: soft, mild diffuse tenderness, most tender BLQ, no rebound or guarding, not distended; no masses,  no organomegaly  Pelvic: External genitalia:  no lesions              Urethra:  normal appearing urethra with no masses, tenderness or lesions              Bartholins  and Skenes: normal                 Vagina: normal appearing vagina with normal color and discharge, no lesions              Cervix: no cervical motion tenderness and no lesions              Bimanual Exam:  Uterus:  uterus retroverted, tender, mobile, normal sized.              Adnexa: no clear masses, mildly tender bilaterally.                Chaperone was present for exam.  ASSESSMENT Severe pelvic pain, started with intercourse, initially constant, now intermittent She doesn't have an acute abdomen, suspicious for ruptured cyst    PLAN UPT negative Send urine for GC/CT Return tomorrow for a gyn ultrasound Percocet for pain (#10) Continue with ibuprofen (800 mg tid) Patient instructed to call with worsening pain, fevers or any other concerns   An After Visit Summary was printed and given to the patient.

## 2015-12-17 NOTE — Telephone Encounter (Signed)
Left message to call Leandria Thier at [phone]. 

## 2015-12-18 ENCOUNTER — Ambulatory Visit (INDEPENDENT_AMBULATORY_CARE_PROVIDER_SITE_OTHER): Payer: 59

## 2015-12-18 ENCOUNTER — Encounter: Payer: Self-pay | Admitting: Obstetrics and Gynecology

## 2015-12-18 ENCOUNTER — Ambulatory Visit (INDEPENDENT_AMBULATORY_CARE_PROVIDER_SITE_OTHER): Payer: 59 | Admitting: Obstetrics and Gynecology

## 2015-12-18 VITALS — BP 102/60 | HR 64 | Resp 15 | Wt 127.0 lb

## 2015-12-18 DIAGNOSIS — R102 Pelvic and perineal pain unspecified side: Secondary | ICD-10-CM

## 2015-12-18 DIAGNOSIS — N941 Unspecified dyspareunia: Secondary | ICD-10-CM | POA: Diagnosis not present

## 2015-12-18 DIAGNOSIS — N83201 Unspecified ovarian cyst, right side: Secondary | ICD-10-CM

## 2015-12-18 LAB — GC/CHLAMYDIA PROBE AMP
CT PROBE, AMP APTIMA: NOT DETECTED
GC PROBE AMP APTIMA: NOT DETECTED

## 2015-12-18 NOTE — Progress Notes (Signed)
GYNECOLOGY  VISIT   HPI: 25 y.o.   Single  Hispanic  female   G0P0000 with Patient's last menstrual period was 11/18/2015.   here for follow up pelvic pain/ pelvic U/S. She was seen yesterday with pelvic pain, the pain started with intercourse. Today the pain has improved. She is due for her cycle. She reports 4 x over the last year that she has had pain with intercourse just prior to her cycle. The pain is always deep and usually lasts for a couple of seconds. Similar, but much less severe than this recent episode.    GYNECOLOGIC HISTORY: Patient's last menstrual period was 11/18/2015. Contraception:none  Menopausal hormone therapy: none         OB History    Gravida Para Term Preterm AB Living   0 0 0 0 0 0   SAB TAB Ectopic Multiple Live Births   0 0 0 0           There are no active problems to display for this patient.   Past Medical History:  Diagnosis Date  . Chlamydia contact, treated    7/11, 10/11    No past surgical history on file.  Current Outpatient Prescriptions  Medication Sig Dispense Refill  . oxyCODONE-acetaminophen (PERCOCET) 5-325 MG tablet Take 1-2 tablets by mouth every 4 (four) hours as needed. use only as much as needed to relieve pain 10 tablet 0   No current facility-administered medications for this visit.      ALLERGIES: Review of patient's allergies indicates no known allergies.  Family History  Problem Relation Age of Onset  . Diabetes Maternal Grandmother     Social History   Social History  . Marital status: Single    Spouse name: N/A  . Number of children: N/A  . Years of education: N/A   Occupational History  . Not on file.   Social History Main Topics  . Smoking status: Never Smoker  . Smokeless tobacco: Never Used  . Alcohol use 0.0 oz/week  . Drug use: No  . Sexual activity: Yes    Partners: Male    Birth control/ protection: Condom   Other Topics Concern  . Not on file   Social History Narrative  . No  narrative on file    Review of Systems  Constitutional: Negative.   HENT: Negative.   Eyes: Negative.   Respiratory: Negative.   Cardiovascular: Negative.   Gastrointestinal: Negative.   Genitourinary:       Pelvic pain  Musculoskeletal: Negative.   Skin: Negative.   Neurological: Negative.   Endo/Heme/Allergies: Negative.   Psychiatric/Behavioral: Negative.     PHYSICAL EXAMINATION:    BP 102/60 (BP Location: Right Arm, Patient Position: Sitting, Cuff Size: Normal)   Pulse 64   Resp 15   Wt 127 lb (57.6 kg)   LMP 11/18/2015   BMI 25.87 kg/m     General appearance: alert, cooperative and appears stated age  ASSESSMENT Pelvic pain Complex right ovarian cyst c/w hemorrhagic CL Intermittent deep dyspareunia    PLAN Discussed and reviewed ultrasound images with the patient and her mother Discussed ovarian cysts, hemorrhagic CL We talked about her intermittent issues with deep dyspareunia She should control rate and depth of penetration F/U ultrasound in 2 months Discussed the options of OCP's, she declines Call with any concerns   An After Visit Summary was printed and given to the patient.  15 minutes face to face time of which over 50% was spent  in counseling.

## 2015-12-18 NOTE — Patient Instructions (Signed)
Ovarian Cyst An ovarian cyst is a fluid-filled sac that forms on an ovary. The ovaries are small organs that produce eggs in women. Various types of cysts can form on the ovaries. Most are not cancerous. Many do not cause problems, and they often go away on their own. Some may cause symptoms and require treatment. Common types of ovarian cysts include:  Functional cysts--These cysts may occur every month during the menstrual cycle. This is normal. The cysts usually go away with the next menstrual cycle if the woman does not get pregnant. Usually, there are no symptoms with a functional cyst.  Endometrioma cysts--These cysts form from the tissue that lines the uterus. They are also called "chocolate cysts" because they become filled with blood that turns brown. This type of cyst can cause pain in the lower abdomen during intercourse and with your menstrual period.  Cystadenoma cysts--This type develops from the cells on the outside of the ovary. These cysts can get very big and cause lower abdomen pain and pain with intercourse. This type of cyst can twist on itself, cut off its blood supply, and cause severe pain. It can also easily rupture and cause a lot of pain.  Dermoid cysts--This type of cyst is sometimes found in both ovaries. These cysts may contain different kinds of body tissue, such as skin, teeth, hair, or cartilage. They usually do not cause symptoms unless they get very big.  Theca lutein cysts--These cysts occur when too much of a certain hormone (human chorionic gonadotropin) is produced and overstimulates the ovaries to produce an egg. This is most common after procedures used to assist with the conception of a baby (in vitro fertilization). CAUSES   Fertility drugs can cause a condition in which multiple large cysts are formed on the ovaries. This is called ovarian hyperstimulation syndrome.  A condition called polycystic ovary syndrome can cause hormonal imbalances that can lead to  nonfunctional ovarian cysts. SIGNS AND SYMPTOMS  Many ovarian cysts do not cause symptoms. If symptoms are present, they may include:  Pelvic pain or pressure.  Pain in the lower abdomen.  Pain during sexual intercourse.  Increasing girth (swelling) of the abdomen.  Abnormal menstrual periods.  Increasing pain with menstrual periods.  Stopping having menstrual periods without being pregnant. DIAGNOSIS  These cysts are commonly found during a routine or annual pelvic exam. Tests may be ordered to find out more about the cyst. These tests may include:  Ultrasound.  X-ray of the pelvis.  CT scan.  MRI.  Blood tests. TREATMENT  Many ovarian cysts go away on their own without treatment. Your health care provider may want to check your cyst regularly for 2-3 months to see if it changes. For women in menopause, it is particularly important to monitor a cyst closely because of the higher rate of ovarian cancer in menopausal women. When treatment is needed, it may include any of the following:  A procedure to drain the cyst (aspiration). This may be done using a long needle and ultrasound. It can also be done through a laparoscopic procedure. This involves using a thin, lighted tube with a tiny camera on the end (laparoscope) inserted through a small incision.  Surgery to remove the whole cyst. This may be done using laparoscopic surgery or an open surgery involving a larger incision in the lower abdomen.  Hormone treatment or birth control pills. These methods are sometimes used to help dissolve a cyst. HOME CARE INSTRUCTIONS   Only take over-the-counter   or prescription medicines as directed by your health care provider.  Follow up with your health care provider as directed.  Get regular pelvic exams and Pap tests. SEEK MEDICAL CARE IF:   Your periods are late, irregular, or painful, or they stop.  Your pelvic pain or abdominal pain does not go away.  Your abdomen becomes  larger or swollen.  You have pressure on your bladder or trouble emptying your bladder completely.  You have pain during sexual intercourse.  You have feelings of fullness, pressure, or discomfort in your stomach.  You lose weight for no apparent reason.  You feel generally ill.  You become constipated.  You lose your appetite.  You develop acne.  You have an increase in body and facial hair.  You are gaining weight, without changing your exercise and eating habits.  You think you are pregnant. SEEK IMMEDIATE MEDICAL CARE IF:   You have increasing abdominal pain.  You feel sick to your stomach (nauseous), and you throw up (vomit).  You develop a fever that comes on suddenly.  You have abdominal pain during a bowel movement.  Your menstrual periods become heavier than usual. MAKE SURE YOU:  Understand these instructions.  Will watch your condition.  Will get help right away if you are not doing well or get worse.   This information is not intended to replace advice given to you by your health care provider. Make sure you discuss any questions you have with your health care provider.   Document Released: 03/24/2005 Document Revised: 03/29/2013 Document Reviewed: 11/29/2012 Elsevier Interactive Patient Education 2016 [employer].  

## 2016-02-05 ENCOUNTER — Telehealth: Payer: Self-pay | Admitting: Obstetrics and Gynecology

## 2016-02-05 NOTE — Telephone Encounter (Signed)
Called patient to review benefits for a recommended procedure. Left Voicemail requesting a call back. °

## 2016-02-11 NOTE — Telephone Encounter (Signed)
Called patient to review benefits for a recommended procedure. Left Voicemail requesting a call back. °

## 2016-02-12 ENCOUNTER — Ambulatory Visit (INDEPENDENT_AMBULATORY_CARE_PROVIDER_SITE_OTHER): Payer: 59 | Admitting: Certified Nurse Midwife

## 2016-02-12 ENCOUNTER — Encounter: Payer: Self-pay | Admitting: Certified Nurse Midwife

## 2016-02-12 VITALS — BP 118/62 | HR 72 | Resp 16 | Ht 59.5 in | Wt 125.0 lb

## 2016-02-12 DIAGNOSIS — Z01419 Encounter for gynecological examination (general) (routine) without abnormal findings: Secondary | ICD-10-CM

## 2016-02-12 DIAGNOSIS — N898 Other specified noninflammatory disorders of vagina: Secondary | ICD-10-CM | POA: Diagnosis not present

## 2016-02-12 NOTE — Progress Notes (Signed)
25 y.o. G0P0000 Single  Hispanic Fe here for annual exam. Periods normal, no issues. Patient still has some pain with sexual activity, but seems to be better with no deep penetration. Has ruptured ovarian cyst in 9/17 and feels she has just felt more normal after that in the past month. Sees Urgent care if needed. Some vaginal odor at times, no itching or burning or change in discharge, but would like to make sure. History of BV in past. No other health issues today. Sister pregnant and due in 2/18!  Patient's last menstrual period was 01/21/2016.          Sexually active: Yes.    The current method of family planning is none.    Exercising: Yes.    Cardio Smoker:  no  Health Maintenance: Pap:  11/24/13 Pap smear negative MMG:  n/a Colonoscopy:  n/a BMD:   n/a TDaP:  2011 Shingles: n/a Pneumonia: n/a Hep C and HIV: negative in past Labs: none desired   reports that she has never smoked. She has never used smokeless tobacco. She reports that she drinks alcohol. She reports that she does not use drugs.  Past Medical History:  Diagnosis Date  . Chlamydia contact, treated    7/11, 10/11    History reviewed. No pertinent surgical history.  No current outpatient prescriptions on file.   No current facility-administered medications for this visit.     Family History  Problem Relation Age of Onset  . Diabetes Maternal Grandmother   . Heart Problems Maternal Grandmother   . Hypertension Maternal Grandmother     ROS:  Pertinent items are noted in HPI.  Otherwise, a comprehensive ROS was negative.  Exam:   BP 118/62 (BP Location: Right Arm, Patient Position: Sitting, Cuff Size: Normal)   Pulse 72   Resp 16   Ht 4' 11.5" (1.511 m)   Wt 125 lb (56.7 kg)   LMP 01/21/2016   BMI 24.82 kg/m  Height: 4' 11.5" (151.1 cm) Ht Readings from Last 3 Encounters:  02/12/16 4' 11.5" (1.511 m)  12/17/15 4' 10.75" (1.492 m)  02/07/15 4' 10.75" (1.492 m)    General appearance: alert,  cooperative and appears stated age Head: Normocephalic, without obvious abnormality, atraumatic Neck: no adenopathy, supple, symmetrical, trachea midline and thyroid normal to inspection and palpation Lungs: clear to auscultation bilaterally Breasts: normal appearance, no masses or tenderness, No nipple retraction or dimpling, No nipple discharge or bleeding, No axillary or supraclavicular adenopathy Heart: regular rate and rhythm Abdomen: soft, non-tender; no masses,  no organomegaly Extremities: extremities normal, atraumatic, no cyanosis or edema Skin: Skin color, texture, turgor normal. No rashes or lesions Lymph nodes: Cervical, supraclavicular, and axillary nodes normal. No abnormal inguinal nodes palpated Neurologic: Grossly normal   Pelvic: External genitalia:  no lesions              Urethra:  normal appearing urethra with no masses, tenderness or lesions              Bartholin's and Skene's: normal                 Vagina: normal appearing vagina with normal color, slight odor and discharge, no lesions, affirm taken              Cervix: no cervical motion tenderness, no lesions and nulliparous appearance              Pap taken: No. Bimanual Exam:  Uterus:  normal size, contour, position,  consistency, mobility, non-tender              Adnexa: normal adnexa and no mass, fullness, tenderness               Rectovaginal: Confirms               Anus:  normal appearance  Chaperone present: yes  A:  Well Woman with normal exam  Contraception none desired  Vaginal odor  History of ruptured cyst on right 9/17    P:   Reviewed health and wellness pertinent to exam  Aware of possible pregnancy with no contraception, patient aware and does use withdrawal.  Will treat per affirm if indicated  Patient aware of signs and symptoms if occurs again and need to come in.  Pap smear as above not taken   counseled on breast self exam, STD prevention, HIV risk factors and prevention, family  planning choices, adequate intake of calcium and vitamin D, diet and exercise  return annually or prn  An After Visit Summary was printed and given to the patient.

## 2016-02-12 NOTE — Patient Instructions (Signed)
General topics  Next pap or exam is  due in 1 year Take a Women's multivitamin Take 1200 mg. of calcium daily - prefer dietary If any concerns in interim to call back  Breast Self-Awareness Practicing breast self-awareness may pick up problems early, prevent significant medical complications, and possibly save your life. By practicing breast self-awareness, you can become familiar with how your breasts look and feel and if your breasts are changing. This allows you to notice changes early. It can also offer you some reassurance that your breast health is good. One way to learn what is normal for your breasts and whether your breasts are changing is to do a breast self-exam. If you find a lump or something that was not present in the past, it is best to contact your caregiver right away. Other findings that should be evaluated by your caregiver include nipple discharge, especially if it is bloody; skin changes or reddening; areas where the skin seems to be pulled in (retracted); or new lumps and bumps. Breast pain is seldom associated with cancer (malignancy), but should also be evaluated by a caregiver. BREAST SELF-EXAM The best time to examine your breasts is 5 7 days after your menstrual period is over.  ExitCare Patient Information 2013 [place].   Exercise to Stay Healthy Exercise helps you become and stay healthy. EXERCISE IDEAS AND TIPS Choose exercises that:  You enjoy.  Fit into your day. You do not need to exercise really hard to be healthy. You can do exercises at a slow or medium level and stay healthy. You can:  Stretch before and after working out.  Try yoga, Pilates, or tai chi.  Lift weights.  Walk fast, swim, jog, run, climb stairs, bicycle, dance, or rollerskate.  Take aerobic classes. Exercises that burn about 150 calories:  Running 1  miles in 15 minutes.  Playing volleyball for 45 to 60 minutes.  Washing and waxing a car for 45 to 60  minutes.  Playing touch football for 45 minutes.  Walking 1  miles in 35 minutes.  Pushing a stroller 1  miles in 30 minutes.  Playing basketball for 30 minutes.  Raking leaves for 30 minutes.  Bicycling 5 miles in 30 minutes.  Walking 2 miles in 30 minutes.  Dancing for 30 minutes.  Shoveling snow for 15 minutes.  Swimming laps for 20 minutes.  Walking up stairs for 15 minutes.  Bicycling 4 miles in 15 minutes.  Gardening for 30 to 45 minutes.  Jumping rope for 15 minutes.  Washing windows or floors for 45 to 60 minutes. Document Released: 04/26/2010 Document Revised: 06/16/2011 Document Reviewed: 04/26/2010 [facility] Patient Information 2013 [place].   Other topics ( that may be useful information):    Sexually Transmitted Disease Sexually transmitted disease (STD) refers to any infection that is passed from person to person during sexual activity. This may happen by way of saliva, semen, blood, vaginal mucus, or urine. Common STDs include:  Gonorrhea.  Chlamydia.  Syphilis.  HIV/AIDS.  Genital herpes.  Hepatitis B and C.  Trichomonas.  Human papillomavirus (HPV).  Pubic lice. CAUSES  An STD may be spread by bacteria, virus, or parasite. A person can get an STD by:  Sexual intercourse with an infected person.  Sharing sex toys with an infected person.  Sharing needles with an infected person.  Having intimate contact with the genitals, mouth, or rectal areas of an infected person. SYMPTOMS  Some people may not have any symptoms, but  they can still pass the infection to others. Different STDs have different symptoms. Symptoms include:  Painful or bloody urination.  Pain in the pelvis, abdomen, vagina, anus, throat, or eyes.  Skin rash, itching, irritation, growths, or sores (lesions). These usually occur in the genital or anal area.  Abnormal vaginal discharge.  Penile discharge in men.  Soft, flesh-colored skin growths in the  genital or anal area.  Fever.  Pain or bleeding during sexual intercourse.  Swollen glands in the groin area.  Yellow skin and eyes (jaundice). This is seen with hepatitis. DIAGNOSIS  To make a diagnosis, your caregiver may:  Take a medical history.  Perform a physical exam.  Take a specimen (culture) to be examined.  Examine a sample of discharge under a microscope.  Perform blood test TREATMENT   Chlamydia, gonorrhea, trichomonas, and syphilis can be cured with antibiotic medicine.  Genital herpes, hepatitis, and HIV can be treated, but not cured, with prescribed medicines. The medicines will lessen the symptoms.  Genital warts from HPV can be treated with medicine or by freezing, burning (electrocautery), or surgery. Warts may come back.  HPV is a virus and cannot be cured with medicine or surgery.However, abnormal areas may be followed very closely by your caregiver and may be removed from the cervix, vagina, or vulva through office procedures or surgery. If your diagnosis is confirmed, your recent sexual partners need treatment. This is true even if they are symptom-free or have a negative culture or evaluation. They should not have sex until their caregiver says it is okay. HOME CARE INSTRUCTIONS  All sexual partners should be informed, tested, and treated for all STDs.  Take your antibiotics as directed. Finish them even if you start to feel better.  Only take over-the-counter or prescription medicines for pain, discomfort, or fever as directed by your caregiver.  Rest.  Eat a balanced diet and drink enough fluids to keep your urine clear or pale yellow.  Do not have sex until treatment is completed and you have followed up with your caregiver. STDs should be checked after treatment.  Keep all follow-up appointments, Pap tests, and blood tests as directed by your caregiver.  Only use latex condoms and water-soluble lubricants during sexual activity. Do not use  petroleum jelly or oils.  Avoid alcohol and illegal drugs.  Get vaccinated for HPV and hepatitis. If you have not received these vaccines in the past, talk to your caregiver about whether one or both might be right for you.  Avoid risky sex practices that can break the skin. The only way to avoid getting an STD is to avoid all sexual activity.Latex condoms and dental dams (for oral sex) will help lessen the risk of getting an STD, but will not completely eliminate the risk. SEEK MEDICAL CARE IF:   You have a fever.  You have any new or worsening symptoms. Document Released: 06/14/2002 Document Revised: 06/16/2011 Document Reviewed: 06/21/2010 [facility] Patient Information 2013 [place].    Domestic Abuse You are being battered or abused if someone close to you hits, pushes, or physically hurts you in any way. You also are being abused if you are forced into activities. You are being sexually abused if you are forced to have sexual contact of any kind. You are being emotionally abused if you are made to feel worthless or if you are constantly threatened. It is important to remember that help is available. No one has the right to abuse you. PREVENTION OF FURTHER  ABUSE  Learn the warning signs of danger. This varies with situations but may include: the use of alcohol, threats, isolation from friends and family, or forced sexual contact. Leave if you feel that violence is going to occur.  If you are attacked or beaten, report it to the police so the abuse is documented. You do not have to press charges. The police can protect you while you or the attackers are leaving. Get the officer's name and badge number and a copy of the report.  Find someone you can trust and tell them what is happening to you: your caregiver, a nurse, clergy member, close friend or family member. Feeling ashamed is natural, but remember that you have done nothing wrong. No one deserves abuse. Document Released:  03/21/2000 Document Revised: 06/16/2011 Document Reviewed: 05/30/2010 ExitCare Patient Information 2013 [employer].    How Much is Too Much Alcohol? Drinking too much alcohol can cause injury, accidents, and health problems. These types of problems can include:   Car crashes.  Falls.  Family fighting (domestic violence).  Drowning.  Fights.  Injuries.  Burns.  Damage to certain organs.  Having a baby with birth defects. ONE DRINK CAN BE TOO MUCH WHEN YOU ARE:  Working.  Pregnant or breastfeeding.  Taking medicines. Ask your doctor.  Driving or planning to drive. If you or someone you know has a drinking problem, get help from a doctor.  Document Released: 01/18/2009 Document Revised: 06/16/2011 Document Reviewed: 01/18/2009 ExitCare Patient Information 2013 [employer].   Smoking Hazards Smoking cigarettes is extremely bad for your health. Tobacco smoke has over 200 known poisons in it. There are over 60 chemicals in tobacco smoke that cause cancer. Some of the chemicals found in cigarette smoke include:   Cyanide.  Benzene.  Formaldehyde.  Methanol (wood alcohol).  Acetylene (fuel used in welding torches).  Ammonia. Cigarette smoke also contains the poisonous gases nitrogen oxide and carbon monoxide.  Cigarette smokers have an increased risk of many serious medical problems and Smoking causes approximately:  90% of all lung cancer deaths in men.  80% of all lung cancer deaths in women.  90% of deaths from chronic obstructive lung disease. Compared with nonsmokers, smoking increases the risk of:  Coronary heart disease by 2 to 4 times.  Stroke by 2 to 4 times.  Men developing lung cancer by 23 times.  Women developing lung cancer by 13 times.  Dying from chronic obstructive lung diseases by 12 times.  . Smoking is the most preventable cause of death and disease in our society.  WHY IS SMOKING ADDICTIVE?  Nicotine is the chemical  agent in tobacco that is capable of causing addiction or dependence.  When you smoke and inhale, nicotine is absorbed rapidly into the bloodstream through your lungs. Nicotine absorbed through the lungs is capable of creating a powerful addiction. Both inhaled and non-inhaled nicotine may be addictive.  Addiction studies of cigarettes and spit tobacco show that addiction to nicotine occurs mainly during the teen years, when young people begin using tobacco products. WHAT ARE THE BENEFITS OF QUITTING?  There are many health benefits to quitting smoking.   Likelihood of developing cancer and heart disease decreases. Health improvements are seen almost immediately.  Blood pressure, pulse rate, and breathing patterns start returning to normal soon after quitting. QUITTING SMOKING   American Lung Association - 1-800-LUNGUSA  American Cancer Society - 1-800-ACS-2345 Document Released: 05/01/2004 Document Revised: 06/16/2011 Document Reviewed: 01/03/2009 ExitCare Patient Information 2013 ExitCare,   LLC.   Stress Management Stress is a state of physical or mental tension that often results from changes in your life or normal routine. Some common causes of stress are:  Death of a loved one.  Injuries or severe illnesses.  Getting fired or changing jobs.  Moving into a new home. Other causes may be:  Sexual problems.  Business or financial losses.  Taking on a large debt.  Regular conflict with someone at home or at work.  Constant tiredness from lack of sleep. It is not just bad things that are stressful. It may be stressful to:  Win the lottery.  Get married.  Buy a new car. The amount of stress that can be easily tolerated varies from person to person. Changes generally cause stress, regardless of the types of change. Too much stress can affect your health. It may lead to physical or emotional problems. Too little stress (boredom) may also become stressful. SUGGESTIONS TO  REDUCE STRESS:  Talk things over with your family and friends. It often is helpful to share your concerns and worries. If you feel your problem is serious, you may want to get help from a professional counselor.  Consider your problems one at a time instead of lumping them all together. Trying to take care of everything at once may seem impossible. List all the things you need to do and then start with the most important one. Set a goal to accomplish 2 or 3 things each day. If you expect to do too many in a single day you will naturally fail, causing you to feel even more stressed.  Do not use alcohol or drugs to relieve stress. Although you may feel better for a short time, they do not remove the problems that caused the stress. They can also be habit forming.  Exercise regularly - at least 3 times per week. Physical exercise can help to relieve that "uptight" feeling and will relax you.  The shortest distance between despair and hope is often a good night's sleep.  Go to bed and get up on time allowing yourself time for appointments without being rushed.  Take a short "time-out" period from any stressful situation that occurs during the day. Close your eyes and take some deep breaths. Starting with the muscles in your face, tense them, hold it for a few seconds, then relax. Repeat this with the muscles in your neck, shoulders, hand, stomach, back and legs.  Take good care of yourself. Eat a balanced diet and get plenty of rest.  Schedule time for having fun. Take a break from your daily routine to relax. HOME CARE INSTRUCTIONS   Call if you feel overwhelmed by your problems and feel you can no longer manage them on your own.  Return immediately if you feel like hurting yourself or someone else. Document Released: 09/17/2000 Document Revised: 06/16/2011 Document Reviewed: 05/10/2007 ExitCare Patient Information 2013 ExitCare, LLC.   

## 2016-02-13 ENCOUNTER — Telehealth: Payer: Self-pay

## 2016-02-13 LAB — WET PREP BY MOLECULAR PROBE
CANDIDA SPECIES: NEGATIVE
GARDNERELLA VAGINALIS: POSITIVE — AB
TRICHOMONAS VAG: NEGATIVE

## 2016-02-13 NOTE — Telephone Encounter (Signed)
-----   Message from Verner Choleborah S Leonard, CNM sent at 02/13/2016  7:34 AM EST ----- Notify patient that affirm is positive for BV, negative for yeast and trichomonas Will treatment for BV, but not on any contraception, will need to call on period for Flagyl to be called and she can treat.

## 2016-02-13 NOTE — Telephone Encounter (Signed)
lmtcb

## 2016-02-13 NOTE — Progress Notes (Signed)
Encounter reviewed Jill Jertson, MD   

## 2016-02-14 NOTE — Telephone Encounter (Signed)
Left message for call back.

## 2016-02-15 NOTE — Telephone Encounter (Signed)
Left message for call back.

## 2016-02-19 NOTE — Telephone Encounter (Signed)
Unable to reach patient. Released results & mychart & will hold for patient to view them.

## 2016-02-21 NOTE — Telephone Encounter (Signed)
Unable to reach patient.  Letter sent.

## 2016-03-04 NOTE — Progress Notes (Signed)
Yes can close encounter 

## 2016-03-05 NOTE — Progress Notes (Signed)
yes

## 2016-03-05 NOTE — Telephone Encounter (Signed)
No callback from patient. Per Ortencia Kick Leonard, CNM okay to close encounter.

## 2016-03-21 ENCOUNTER — Encounter: Payer: Self-pay | Admitting: *Deleted

## 2016-06-25 ENCOUNTER — Encounter (HOSPITAL_COMMUNITY): Payer: Self-pay | Admitting: Emergency Medicine

## 2016-06-25 ENCOUNTER — Emergency Department (HOSPITAL_COMMUNITY)
Admission: EM | Admit: 2016-06-25 | Discharge: 2016-06-25 | Disposition: A | Discharge status: Home / Self Care | Payer: 59 | Attending: Emergency Medicine | Admitting: Emergency Medicine

## 2016-06-25 DIAGNOSIS — R197 Diarrhea, unspecified: Secondary | ICD-10-CM | POA: Diagnosis not present

## 2016-06-25 DIAGNOSIS — R1084 Generalized abdominal pain: Secondary | ICD-10-CM

## 2016-06-25 DIAGNOSIS — R110 Nausea: Secondary | ICD-10-CM | POA: Diagnosis present

## 2016-06-25 LAB — CBC WITH DIFFERENTIAL/PLATELET
BASOS PCT: 0 %
Basophils Absolute: 0 10*3/uL (ref 0.0–0.1)
EOS ABS: 0.1 10*3/uL (ref 0.0–0.7)
EOS PCT: 1 %
HCT: 34.7 % — ABNORMAL LOW (ref 36.0–46.0)
Hemoglobin: 11.7 g/dL — ABNORMAL LOW (ref 12.0–15.0)
LYMPHS ABS: 1.4 10*3/uL (ref 0.7–4.0)
Lymphocytes Relative: 25 %
MCH: 29.5 pg (ref 26.0–34.0)
MCHC: 33.7 g/dL (ref 30.0–36.0)
MCV: 87.6 fL (ref 78.0–100.0)
MONOS PCT: 7 %
Monocytes Absolute: 0.4 10*3/uL (ref 0.1–1.0)
Neutro Abs: 3.6 10*3/uL (ref 1.7–7.7)
Neutrophils Relative %: 67 %
PLATELETS: 252 10*3/uL (ref 150–400)
RBC: 3.96 MIL/uL (ref 3.87–5.11)
RDW: 12.1 % (ref 11.5–15.5)
WBC: 5.5 10*3/uL (ref 4.0–10.5)

## 2016-06-25 LAB — COMPREHENSIVE METABOLIC PANEL
ALT: 17 U/L (ref 14–54)
ANION GAP: 7 (ref 5–15)
AST: 20 U/L (ref 15–41)
Albumin: 4.2 g/dL (ref 3.5–5.0)
Alkaline Phosphatase: 54 U/L (ref 38–126)
BUN: 13 mg/dL (ref 6–20)
CHLORIDE: 107 mmol/L (ref 101–111)
CO2: 24 mmol/L (ref 22–32)
Calcium: 9.3 mg/dL (ref 8.9–10.3)
Creatinine, Ser: 0.64 mg/dL (ref 0.44–1.00)
GFR calc non Af Amer: >60 mL/min (ref >60)
Glucose, Bld: 96 mg/dL (ref 65–99)
POTASSIUM: 4.1 mmol/L (ref 3.5–5.1)
SODIUM: 138 mmol/L (ref 135–145)
Total Bilirubin: 0.4 mg/dL (ref 0.3–1.2)
Total Protein: 7.6 g/dL (ref 6.5–8.1)

## 2016-06-25 LAB — URINALYSIS, ROUTINE W REFLEX MICROSCOPIC
Bilirubin Urine: NEGATIVE
GLUCOSE, UA: NEGATIVE mg/dL
Ketones, ur: NEGATIVE mg/dL
NITRITE: NEGATIVE
PROTEIN: NEGATIVE mg/dL
Specific Gravity, Urine: 1.014 (ref 1.005–1.030)
pH: 5 (ref 5.0–8.0)

## 2016-06-25 LAB — POC URINE PREG, ED: PREG TEST UR: NEGATIVE

## 2016-06-25 LAB — LIPASE, BLOOD: Lipase: 22 U/L (ref 11–51)

## 2016-06-25 MED ORDER — GI COCKTAIL ~~LOC~~
30.0000 mL | Freq: Once | ORAL | Status: AC
Start: 2016-06-25 — End: 2016-06-25
  Administered 2016-06-25: 30 mL via ORAL
  Filled 2016-06-25: qty 30

## 2016-06-25 MED ORDER — DICYCLOMINE HCL 20 MG PO TABS: 20.0000 mg | ORAL_TABLET | Freq: Two times a day (BID) | ORAL | 0 refills | Status: DC

## 2016-06-25 MED ORDER — ONDANSETRON 8 MG PO TBDP
8.0000 mg | ORAL_TABLET | Freq: Once | ORAL | Status: AC
  Administered 2016-06-25: 8 mg via ORAL
  Filled 2016-06-25: qty 1

## 2016-06-25 MED ORDER — ONDANSETRON 8 MG PO TBDP: 8.0000 mg | ORAL_TABLET | Freq: Three times a day (TID) | ORAL | 0 refills | Status: DC | PRN

## 2016-06-25 NOTE — Discharge Instructions (Signed)
Drink plenty of fluids. Bentyl for abdominal cramping as prescribed. Zofran for nausea and vomiting. You can take imodium for diarrhea as needed. Follow up with family doctor if not improving. Return if worsening symptoms.

## 2016-06-25 NOTE — ED Triage Notes (Signed)
Patient states had sudden onset upper abd pain with diarrhea. Patient denies any vomiting.  Patient states that had less than 3 loose BMs.

## 2016-06-25 NOTE — ED Provider Notes (Signed)
[facility]-EMERGENCY DEPT Provider Note   CSN: [id number] Arrival date & time: 06/25/16  1051     History   Chief Complaint Chief Complaint  Patient presents with  . Abdominal Pain  . Diarrhea    HPI Patricia Montgomery is a 26 y.o. female.  HPI Patricia Montgomery is a 26 y.o. female presents to ED with complaint of nausea, upper abdominal pain, diarrhea, onset 1 hour ago. Denies vomiting. No hx of the same. No urinary symptoms. No blood in stool. 1 episode of loose stool. No fever, chills, malaise. No treatment prior to coming in. Pain is sharp, does not radiate. Denies pregnancy.  Past Medical History:  Diagnosis Date  . Chlamydia contact, treated    7/11, 10/11    There are no active problems to display for this patient.   History reviewed. No pertinent surgical history.  OB History    Gravida Para Term Preterm AB Living   0 0 0 0 0 0   SAB TAB Ectopic Multiple Live Births   0 0 0 0         Home Medications    Prior to Admission medications   Not on File    Family History Family History  Problem Relation Age of Onset  . Diabetes Maternal Grandmother   . Heart Problems Maternal Grandmother   . Hypertension Maternal Grandmother     Social History Social History  Substance Use Topics  . Smoking status: Never Smoker  . Smokeless tobacco: Never Used  . Alcohol use 0.0 oz/week     Allergies   Patient has no known allergies.   Review of Systems Review of Systems  Constitutional: Negative for chills and fever.  Respiratory: Negative for cough, chest tightness and shortness of breath.   Cardiovascular: Negative for chest pain, palpitations and leg swelling.  Gastrointestinal: Positive for abdominal pain, diarrhea and nausea. Negative for vomiting.  Genitourinary: Negative for dysuria, flank pain, pelvic pain, vaginal bleeding, vaginal discharge and vaginal pain.  Musculoskeletal: Negative for arthralgias, myalgias, neck pain and neck stiffness.  Skin:  Negative for rash.  Neurological: Negative for dizziness, weakness and headaches.  All other systems reviewed and are negative.    Physical Exam Updated Vital Signs BP 132/84 (BP Location: Left Arm)   Pulse 63   Temp 98.6 F (37 C) (Oral)   Resp 18   Ht 4\' 11"  (1.499 m)   LMP 06/15/2016   SpO2 96%   Physical Exam  Constitutional: She appears well-developed and well-nourished. No distress.  HENT:  Head: Normocephalic.  Eyes: Conjunctivae are normal.  Neck: Neck supple.  Cardiovascular: Normal rate, regular rhythm and normal heart sounds.   Pulmonary/Chest: Effort normal and breath sounds normal. No respiratory distress. She has no wheezes. She has no rales.  Abdominal: Soft. Bowel sounds are normal. She exhibits no distension. There is tenderness. There is no rebound.  RUQ tenderness  Musculoskeletal: She exhibits no edema.  Neurological: She is alert.  Skin: Skin is warm and dry.  Psychiatric: She has a normal mood and affect. Her behavior is normal.  Nursing note and vitals reviewed.    ED Treatments / Results  Labs (all labs ordered are listed, but only abnormal results are displayed) Labs Reviewed  CBC WITH DIFFERENTIAL/PLATELET - Abnormal; Notable for the following:       Result Value   Hemoglobin 11.7 (*)    HCT 34.7 (*)    All other components within normal limits  URINALYSIS, ROUTINE W REFLEX MICROSCOPIC -  Abnormal; Notable for the following:    APPearance HAZY (*)    Hgb urine dipstick SMALL (*)    Leukocytes, UA TRACE (*)    Bacteria, UA RARE (*)    Squamous Epithelial / LPF 6-30 (*)    All other components within normal limits  COMPREHENSIVE METABOLIC PANEL  LIPASE, BLOOD  POC URINE PREG, ED    EKG  EKG Interpretation None       Radiology No results found.  Procedures Procedures (including critical care time)  Medications Ordered in ED Medications  gi cocktail (Maalox,Lidocaine,Donnatal) (30 mLs Oral Given 06/25/16 1138)  ondansetron  (ZOFRAN-ODT) disintegrating tablet 8 mg (8 mg Oral Given 06/25/16 1242)     Initial Impression / Assessment and Plan / ED Course  I have reviewed the triage vital signs and the nursing notes.  Pertinent labs & imaging results that were available during my care of the patient were reviewed by me and considered in my medical decision making (see chart for details).     Pt in ED with epigastric pain, nausea, diarrhea (1 episode) onset 1 hour ago. Abdomen soft, no peritoneal signs. Will get  Labs, UA, preg test. Will try GI cocktail.   12:37 PM Feeling improved after GI cocktail. Still having intermittent cramping and nausea. No vomiting. No episodes of diarrhea. Pt works in Teacher, music. Question gastroenteritis. No acute/surgical abdomen. Labs normal. Will try supportive care. Bentyl, zofran, imodium as needed. Follow up with pcp. Return precuations discussed.   Vitals:   06/25/16 1058 06/25/16 1100 06/25/16 1243  BP:  132/84 122/76  Pulse:  63 68  Resp:  18 17  Temp:  98.6 F (37 C)   TempSrc:  Oral   SpO2:  96% 98%  Height: 4\' 11"  (1.499 m)      Final Clinical Impressions(s) / ED Diagnoses   Final diagnoses:  Generalized abdominal pain  Diarrhea, unspecified type    New Prescriptions New Prescriptions   DICYCLOMINE (BENTYL) 20 MG TABLET    Take 1 tablet (20 mg total) by mouth 2 (two) times daily.   ONDANSETRON (ZOFRAN ODT) 8 MG DISINTEGRATING TABLET    Take 1 tablet (8 mg total) by mouth every 8 (eight) hours as needed for nausea or vomiting.     Jaynie Crumble, PA-C 06/25/16 1245    Gerhard Munch, MD 06/25/16 [phone]

## 2016-11-28 ENCOUNTER — Telehealth: Payer: 59 | Admitting: Family

## 2016-11-28 DIAGNOSIS — J028 Acute pharyngitis due to other specified organisms: Secondary | ICD-10-CM

## 2016-11-28 DIAGNOSIS — B9689 Other specified bacterial agents as the cause of diseases classified elsewhere: Secondary | ICD-10-CM

## 2016-11-28 MED ORDER — AZITHROMYCIN 250 MG PO TABS: ORAL_TABLET | ORAL | 0 refills | Status: DC

## 2016-11-28 MED ORDER — PREDNISONE 5 MG PO TABS: 5.0000 mg | ORAL_TABLET | ORAL | 0 refills | Status: DC

## 2016-11-28 MED ORDER — BENZONATATE 100 MG PO CAPS: 100.0000 mg | ORAL_CAPSULE | Freq: Three times a day (TID) | ORAL | 0 refills | Status: DC | PRN

## 2016-11-28 NOTE — Progress Notes (Signed)
Thank you for the details you put in the comment boxes. Those details really help [place] take better care of you. Given that this is going on longer than a week, it is likely bacterial.   We are sorry that you are not feeling well.  Here is how we plan to help!  Based on your presentation I believe you most likely have A cough due to bacteria.  When patients have a fever and a productive cough with a change in color or increased sputum production, we are concerned about bacterial bronchitis.  If left untreated it can progress to pneumonia.  If your symptoms do not improve with your treatment plan it is important that you contact your provider.   I have prescribed Azithromyin 250 mg: two tables now and then one tablet daily for 4 additonal days    In addition you may use A non-prescription cough medication called Mucinex DM: take 2 tablets every 12 hours. and A prescription cough medication called Tessalon Perles 100mg . You may take 1-2 capsules every 8 hours as needed for your cough.  Sterapred 5 mg dosepak  From your responses in the eVisit questionnaire you describe inflammation in the upper respiratory tract which is causing a significant cough.  This is commonly called Bronchitis and has four common causes:    Allergies  Viral Infections  Acid Reflux  Bacterial Infection Allergies, viruses and acid reflux are treated by controlling symptoms or eliminating the cause. An example might be a cough caused by taking certain blood pressure medications. You stop the cough by changing the medication. Another example might be a cough caused by acid reflux. Controlling the reflux helps control the cough.  USE OF BRONCHODILATOR ("RESCUE") INHALERS: There is a risk from using your bronchodilator too frequently.  The risk is that over-reliance on a medication which only relaxes the muscles surrounding the breathing tubes can reduce the effectiveness of medications prescribed to reduce swelling and congestion  of the tubes themselves.  Although you feel brief relief from the bronchodilator inhaler, your asthma may actually be worsening with the tubes becoming more swollen and filled with mucus.  This can delay other crucial treatments, such as oral steroid medications. If you need to use a bronchodilator inhaler daily, several times per day, you should discuss this with your provider.  There are probably better treatments that could be used to keep your asthma under control.     HOME CARE . Only take medications as instructed by your medical team. . Complete the entire course of an antibiotic. . Drink plenty of fluids and get plenty of rest. . Avoid close contacts especially the very young and the elderly . Cover your mouth if you cough or cough into your sleeve. . Always remember to wash your hands . A steam or ultrasonic humidifier can help congestion.   GET HELP RIGHT AWAY IF: . You develop worsening fever. . You become short of breath . You cough up blood. . Your symptoms persist after you have completed your treatment plan MAKE SURE YOU   Understand these instructions.  Will watch your condition.  Will get help right away if you are not doing well or get worse.  Your e-visit answers were reviewed by a board certified advanced clinical practitioner to complete your personal care plan.  Depending on the condition, your plan could have included both over the counter or prescription medications. If there is a problem please reply  once you have received a response from  your provider. Your safety is important to [place].  If you have drug allergies check your prescription carefully.    You can use MyChart to ask questions about today's visit, request a non-urgent call back, or ask for a work or school excuse for 24 hours related to this e-Visit. If it has been greater than 24 hours you will need to follow up with your provider, or enter a new e-Visit to address those concerns. You will get an  e-mail in the next two days asking about your experience.  I hope that your e-visit has been valuable and will speed your recovery. Thank you for using e-visits.

## 2017-02-12 ENCOUNTER — Ambulatory Visit: Payer: 59 | Admitting: Certified Nurse Midwife

## 2017-03-24 ENCOUNTER — Encounter: Payer: Self-pay | Admitting: Certified Nurse Midwife

## 2017-03-24 ENCOUNTER — Ambulatory Visit (INDEPENDENT_AMBULATORY_CARE_PROVIDER_SITE_OTHER): Payer: 59 | Admitting: Certified Nurse Midwife

## 2017-03-24 ENCOUNTER — Other Ambulatory Visit (HOSPITAL_COMMUNITY)
Admission: RE | Admit: 2017-03-24 | Discharge: 2017-03-24 | Disposition: A | Discharge status: Home / Self Care | Payer: 59 | Source: Ambulatory Visit | Attending: Obstetrics & Gynecology | Admitting: Obstetrics & Gynecology

## 2017-03-24 ENCOUNTER — Other Ambulatory Visit: Payer: Self-pay

## 2017-03-24 VITALS — BP 110/70 | HR 68 | Resp 16 | Ht 58.25 in | Wt 128.0 lb

## 2017-03-24 DIAGNOSIS — Z124 Encounter for screening for malignant neoplasm of cervix: Secondary | ICD-10-CM | POA: Insufficient documentation

## 2017-03-24 DIAGNOSIS — Z01419 Encounter for gynecological examination (general) (routine) without abnormal findings: Secondary | ICD-10-CM | POA: Diagnosis not present

## 2017-03-24 DIAGNOSIS — Z789 Other specified health status: Secondary | ICD-10-CM | POA: Diagnosis not present

## 2017-03-24 DIAGNOSIS — N912 Amenorrhea, unspecified: Secondary | ICD-10-CM

## 2017-03-24 LAB — POCT URINE PREGNANCY: PREG TEST UR: NEGATIVE

## 2017-03-24 NOTE — Patient Instructions (Signed)

## 2017-03-24 NOTE — Progress Notes (Signed)
26 y.o. G0P0000 Married  Hispanic Fe here for annual exam. Periods monthly, just change in date, until none this month. Negative UPT today. 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 Denies any health problems. Not trying for pregnancy, but not preventing, would be welcome if occurred. Previous history of cyst and thought she would need follow up PUS, but has not had reoccurrence. No other health issues today.  Patient's last menstrual period was 02/20/2017 (exact date).          Sexually active: Yes.    The current method of family planning is none.    Exercising: Yes.    cardio & weights Smoker:  no  Health Maintenance: Pap:  11-24-13 neg History of Abnormal Pap: no MMG:  none Self Breast exams: no Colonoscopy:  none BMD:   none TDaP:  2011 Shingles: no Pneumonia: no Hep C and HIV: HIV neg 2016 Labs: poct upt-neg   reports that  has never smoked. she has never used smokeless tobacco. She reports that she does not drink alcohol or use drugs.  Past Medical History:  Diagnosis Date  . Chlamydia contact, treated    7/11, 10/11    History reviewed. No pertinent surgical history.  No current outpatient medications on file.   No current facility-administered medications for this visit.     Family History  Problem Relation Age of Onset  . Diabetes Maternal Grandmother   . Heart Problems Maternal Grandmother   . Hypertension Maternal Grandmother     ROS:  Pertinent items are noted in HPI.  Otherwise, a comprehensive ROS was negative.  Exam:   BP 110/70   Pulse 68   Resp 16   Ht 4' 10.25" (1.48 m)   Wt 128 lb (58.1 kg)   LMP 02/20/2017 (Exact Date)   BMI 26.52 kg/m  Height: 4' 10.25" (148 cm) Ht Readings from Last 3 Encounters:  03/24/17 4' 10.25" (1.48 m)  06/25/16 4\' 11"  (1.499 m)  02/12/16 4' 11.5" (1.511 m)    General appearance: alert, cooperative and appears stated age Head: Normocephalic, without obvious abnormality,  atraumatic Neck: no adenopathy, supple, symmetrical, trachea midline and thyroid normal to inspection and palpation Lungs: clear to auscultation bilaterally Breasts: normal appearance, no masses or tenderness, No nipple retraction or dimpling, No nipple discharge or bleeding, No axillary or supraclavicular adenopathy Heart: regular rate and rhythm Abdomen: soft, non-tender; no masses,  no organomegaly Extremities: extremities normal, atraumatic, no cyanosis or edema Skin: Skin color, texture, turgor normal. No rashes or lesions Lymph nodes: Cervical, supraclavicular, and axillary nodes normal. No abnormal inguinal nodes palpated Neurologic: Grossly normal   Pelvic: External genitalia:  no lesions              Urethra:  normal appearing urethra with no masses, tenderness or lesions              Bartholin's and Skene's: normal                 Vagina: normal appearing vagina with normal color and discharge, no lesions              Cervix: no bleeding following Pap, no cervical motion tenderness and no lesions              Pap taken: Yes.   Bimanual Exam:  Uterus:  normal size, contour, position, consistency, mobility, non-tender and anteverted  Adnexa: normal adnexa and no mass, fullness, tenderness               Rectovaginal: Confirms               Anus:  normal sphincter tone, no lesions  Chaperone present: yes  A:  Well Woman with normal exam  Contraception none  Amenorrhea with negative UPT and cycle changes for the past 3 months ? Stress related due to nursing school  Rubella status  History of cyst with rupture, will check to see if PUS needed at this point and advise.  History of Chlamydia treated  P:   Reviewed health and wellness pertinent to exam  Discussed importance of prenatal vitamins and encouraged to start daily. Also discussed Rubella screen for immune status with labs, request.  Instructed to call if no period in 3 months or positive UPT. Questions  addressed regarding period change can be related to weight change, thyroid or pituitary. If becomes pregnant needs to come for confirmation and make sure no tubal pregnancy due to risk with Chlamydia history . Voiced understanding.    Lab TSH  Pap smear: yes   counseled on breast self exam, adequate intake of calcium and vitamin D, diet and exercise  return annually or prn  An After Visit Summary was printed and given to the patient.

## 2017-03-25 LAB — CYTOLOGY - PAP: DIAGNOSIS: NEGATIVE

## 2017-03-26 LAB — RUBELLA SCREEN: RUBELLA: 14.4 {index} (ref >0.99)

## 2017-03-26 LAB — TSH: TSH: 1.15 u[IU]/mL (ref 0.450–4.500)

## 2018-01-26 ENCOUNTER — Ambulatory Visit: Payer: Self-pay | Admitting: Family Medicine

## 2018-01-26 VITALS — BP 118/76 | HR 62 | Temp 98.5°F | Resp 20 | Wt 132.2 lb

## 2018-01-26 DIAGNOSIS — H9203 Otalgia, bilateral: Secondary | ICD-10-CM

## 2018-01-26 NOTE — Patient Instructions (Addendum)
Earache, Adult  PLAN< Work note x 24 hours Discontinue use of Q-tips Trial decongestant, use Tylenol/Motin for pain Follow up if symptoms are unimproved.    An earache, or ear pain, can be caused by many things, including:  An infection.  Ear wax buildup.  Ear pressure.  Something in the ear that should not be there (foreign body).  A sore throat.  Tooth problems.  Jaw problems.  Treatment of the earache will depend on the cause. If the cause is not clear or cannot be determined, you may need to watch your symptoms until your earache goes away or until a cause is found. Follow these instructions at home: Pay attention to any changes in your symptoms. Take these actions to help with your pain:  Take or apply over-the-counter and prescription medicines only as told by your health care provider.  If you were prescribed an antibiotic medicine, use it as told by your health care provider. Do not stop using the antibiotic even if you start to feel better.  Do not put anything in your ear other than medicine that is prescribed by your health care provider.  If directed, apply heat to the affected area as often as told by your health care provider. Use the heat source that your health care provider recommends, such as a moist heat pack or a heating pad. ? Place a towel between your skin and the heat source. ? Leave the heat on for 20-30 minutes. ? Remove the heat if your skin turns bright red. This is especially important if you are unable to feel pain, heat, or cold. You may have a greater risk of getting burned.  If directed, put ice on the ear: ? Put ice in a plastic bag. ? Place a towel between your skin and the bag. ? Leave the ice on for 20 minutes, 2-3 times a day.  Try resting in an upright position instead of lying down. This may help to reduce pressure in your ear and relieve pain.  Chew gum if it helps to relieve your ear pain.  Treat any allergies as told by your  health care provider.  Keep all follow-up visits as told by your health care provider. This is important.  Contact a health care provider if:  Your pain does not improve within 2 days.  Your earache gets worse.  You have new symptoms.  You have a fever. Get help right away if:  You have a severe headache.  You have a stiff neck.  You have trouble swallowing.  You have redness or swelling behind your ear.  You have fluid or blood coming from your ear.  You have hearing loss.  You feel dizzy. This information is not intended to replace advice given to you by your health care provider. Make sure you discuss any questions you have with your health care provider. Document Released: 11/09/2003 Document Revised: 11/20/2015 Document Reviewed: 09/17/2015 Elsevier Interactive Patient Education  [employer].

## 2018-01-26 NOTE — Progress Notes (Signed)
Patricia Montgomery is a 27 y.o. female who presents today with concerns of ear pain in both ears after recent airplane travel in the [place]. She reports she is an avid q-tip user and this morning when cleaning her ears that were painful she found blood in each ear on the q-tip. She reports that this concerned her. She reports that she does feel like she "is coming down with something"  Review of Systems  Constitutional: Negative for chills, fever and malaise/fatigue.  HENT: Positive for ear discharge and ear pain. Negative for congestion, sinus pain and sore throat.   Eyes: Negative.   Respiratory: Negative for cough, sputum production and shortness of breath.   Cardiovascular: Negative.  Negative for chest pain.  Gastrointestinal: Negative for abdominal pain, diarrhea, nausea and vomiting.  Genitourinary: Negative for dysuria, frequency, hematuria and urgency.  Musculoskeletal: Negative for myalgias.  Skin: Negative.   Neurological: Negative for headaches.  Endo/Heme/Allergies: Negative.   Psychiatric/Behavioral: Negative.     O: Vitals:   01/26/18 0946  BP: 118/76  Pulse: 62  Resp: 20  Temp: 98.5 F (36.9 C)  SpO2: 98%     Physical Exam  Constitutional: She is oriented to person, place, and time. Vital signs are normal. She appears well-developed and well-nourished. She is active.  Non-toxic appearance. She does not have a sickly appearance.  HENT:  Head: Normocephalic.  Right Ear: Hearing, external ear and ear canal normal. A middle ear effusion is present.  Left Ear: Hearing, external ear and ear canal normal. A middle ear effusion is present.  Nose: Nose normal.  Mouth/Throat: Uvula is midline and oropharynx is clear and moist.  Neck: Normal range of motion. Neck supple.  Cardiovascular: Normal rate, regular rhythm, normal heart sounds and normal pulses.  Pulmonary/Chest: Effort normal and breath sounds normal.  Abdominal: Soft. Bowel sounds are normal.  Musculoskeletal:  Normal range of motion.  Lymphadenopathy:       Head (right side): No submental and no submandibular adenopathy present.       Head (left side): No submental and no submandibular adenopathy present.    She has no cervical adenopathy.  Neurological: She is alert and oriented to person, place, and time.  Psychiatric: She has a normal mood and affect.  Vitals reviewed.  A: 1. Ear pain, bilateral    P: Discussed exam findings, diagnosis etiology and medication use and indications reviewed with patient. Follow- Up and discharge instructions provided. No emergent/urgent issues found on exam.  Patient verbalized understanding of information provided and agrees with plan of care (POC), all questions answered.  1. Ear pain, bilateral Use decongestant for the next few days- if symptoms persist and are unimproved may consider abx of steriod for mild effusion observed today. Patient agrees with plan.

## 2018-01-28 ENCOUNTER — Telehealth: Payer: Self-pay

## 2018-01-28 NOTE — Telephone Encounter (Signed)
Patient states she is doing good.  

## 2018-04-08 ENCOUNTER — Encounter: Payer: Self-pay | Admitting: Certified Nurse Midwife

## 2018-04-08 ENCOUNTER — Ambulatory Visit: Payer: 59 | Admitting: Certified Nurse Midwife

## 2018-04-08 ENCOUNTER — Other Ambulatory Visit: Payer: Self-pay

## 2018-04-08 VITALS — BP 110/70 | HR 68 | Resp 16 | Ht 58.75 in | Wt 129.0 lb

## 2018-04-08 DIAGNOSIS — N6310 Unspecified lump in the right breast, unspecified quadrant: Secondary | ICD-10-CM | POA: Diagnosis not present

## 2018-04-08 DIAGNOSIS — Z01411 Encounter for gynecological examination (general) (routine) with abnormal findings: Secondary | ICD-10-CM

## 2018-04-08 DIAGNOSIS — N644 Mastodynia: Secondary | ICD-10-CM

## 2018-04-08 NOTE — Patient Instructions (Signed)

## 2018-04-08 NOTE — Progress Notes (Signed)
28 y.o. G0P0000 Married  Hispanic Fe here for annual exam. Periods normal, no issues. Enjoying exercise and working with trainer regarding boxing.  Patient has noted shooting pain in right breast periodically and feels very uncomfortable. Does SBE and does not feel a change. Denies nipple discharge or injury to breast or change in skin. Not related to exercise or period. Concerned. No other healthissues today.  Patient's last menstrual period was 03/12/2018 (exact date).          Sexually active: Yes.    The current method of family planning is none.    Exercising: Yes.    walking, boxing, cardio Smoker:  no  Review of Systems  Constitutional: Negative.   HENT: Negative.   Eyes: Negative.   Respiratory: Negative.   Cardiovascular: Negative.   Gastrointestinal: Negative.   Genitourinary: Negative.   Musculoskeletal: Negative.   Skin:       Breast pain  Neurological: Negative.   Endo/Heme/Allergies: Negative.   Psychiatric/Behavioral: Negative.     Health Maintenance: Pap:  11-24-13 neg, 03-24-17 neg History of Abnormal Pap: no MMG:  none Self Breast exams: yes Colonoscopy:  none BMD:   none TDaP:  2011 Shingles: no Pneumonia: no Hep C and HIV: HIV neg 2016 Labs: if needed   reports that she has never smoked. She has never used smokeless tobacco. She reports that she does not drink alcohol or use drugs.  Past Medical History:  Diagnosis Date  . Chlamydia contact, treated    7/11, 10/11    No past surgical history on file.  No current outpatient medications on file.   No current facility-administered medications for this visit.     Family History  Problem Relation Age of Onset  . Diabetes Maternal Grandmother   . Heart Problems Maternal Grandmother   . Hypertension Maternal Grandmother     ROS:  Pertinent items are noted in HPI.  Otherwise, a comprehensive ROS was negative.  Exam:   BP 110/70   Pulse 68   Resp 16   Ht 4' 10.75" (1.492 m)   Wt 129 lb (58.5  kg)   LMP 03/12/2018 (Exact Date)   BMI 26.28 kg/m  Height: 4' 10.75" (149.2 cm) Ht Readings from Last 3 Encounters:  04/08/18 4' 10.75" (1.492 m)  03/24/17 4' 10.25" (1.48 m)  06/25/16 4\' 11"  (1.499 m)    General appearance: alert, cooperative and appears stated age Head: Normocephalic, without obvious abnormality, atraumatic Neck: no adenopathy, supple, symmetrical, trachea midline and thyroid normal to inspection and palpation Lungs: clear to auscultation bilaterally Breasts: No nipple retraction or dimpling, No nipple discharge or bleeding, No axillary or supraclavicular adenopathy, tenderness noted in right breast with ? cysts noted at 11- 12 o'clock and 1 o'clock at areola edge Heart: regular rate and rhythm Abdomen: soft, non-tender; no masses,  no organomegaly Extremities: extremities normal, atraumatic, no cyanosis or edema Skin: Skin color, texture, turgor normal. No rashes or lesions Lymph nodes: Cervical, supraclavicular, and axillary nodes normal. No abnormal inguinal nodes palpated Neurologic: Grossly normal   Pelvic: External genitalia:  no lesions              Urethra:  normal appearing urethra with no masses, tenderness or lesions              Bartholin's and Skene's: normal                 Vagina: normal appearing vagina with normal color and discharge, no lesions  Cervix: no cervical motion tenderness, no lesions and nulliparous appearance              Pap taken: No. Bimanual Exam:  Uterus:  normal size, contour, position, consistency, mobility, non-tender and anteverted              Adnexa: normal adnexa and no mass, fullness, tenderness               Rectovaginal: Confirms               Anus:  Normal appearance, no lesions  Chaperone present: yes  A:  Well Woman with normal exam  Contraception none desired, pregnancy would be welcome, but not trying for pregnancy  Right breast tenderness with ? Cysts vs premenstrual changes  P:   Reviewed  health and wellness pertinent to exam  Discussed finding and would recommend [place] to evaluate. Discussed limiting caffeine use which can increase tenderness. Patient agreeable to [place] schedule. Will be done prior to leaving today.  Pap smear: no   counseled on breast self exam, feminine hygiene, adequate intake of calcium and vitamin D, diet and exercise  return annually or prn  An After Visit Summary was printed and given to the patient.

## 2018-04-08 NOTE — Progress Notes (Signed)
Patient scheduled while in office for right breast US at [facility] on 04/16/18 at 7:30am, arriving at 7:15am. Patient verbalizes understanding and is agreeable.

## 2018-04-16 ENCOUNTER — Ambulatory Visit
Admission: RE | Admit: 2018-04-16 | Discharge: 2018-04-16 | Disposition: A | Discharge status: Home / Self Care | Payer: 59 | Source: Ambulatory Visit | Attending: Certified Nurse Midwife | Admitting: Certified Nurse Midwife

## 2018-04-16 DIAGNOSIS — N644 Mastodynia: Secondary | ICD-10-CM | POA: Diagnosis not present

## 2018-04-16 DIAGNOSIS — N6310 Unspecified lump in the right breast, unspecified quadrant: Secondary | ICD-10-CM

## 2018-04-20 ENCOUNTER — Telehealth: Payer: Self-pay

## 2018-04-20 NOTE — Telephone Encounter (Signed)
-----   Message from Patricia Montgomery, CNM sent at 04/16/2018 12:43 PM EST ----- Notify patient her [place] of right breast shows normal tissue with no suspicious findings. She was premenstrual at exam and ? Cyst palpated. If tenderness is still present needs follow in 2 weeks

## 2018-04-20 NOTE — Telephone Encounter (Signed)
Attempted to reach patient at number provided [phone], there was no answer and voicemail box has not been set up.   

## 2018-05-07 NOTE — Telephone Encounter (Signed)
Attempted to reach patient at number provided [phone], there was no answer and voicemail box has not been set up.

## 2019-04-05 ENCOUNTER — Ambulatory Visit: Payer: 59 | Attending: Internal Medicine

## 2019-04-05 DIAGNOSIS — Z20822 Contact with and (suspected) exposure to covid-19: Secondary | ICD-10-CM

## 2019-04-05 DIAGNOSIS — Z20828 Contact with and (suspected) exposure to other viral communicable diseases: Secondary | ICD-10-CM | POA: Diagnosis not present

## 2019-04-06 LAB — NOVEL CORONAVIRUS, NAA: SARS-CoV-2, NAA: NOT DETECTED

## 2019-04-13 DIAGNOSIS — Z20828 Contact with and (suspected) exposure to other viral communicable diseases: Secondary | ICD-10-CM | POA: Diagnosis not present

## 2019-04-15 ENCOUNTER — Ambulatory Visit: Payer: 59 | Admitting: Certified Nurse Midwife

## 2019-04-19 IMAGING — US ULTRASOUND RIGHT BREAST LIMITED
1 series · 6 of 6 positions shown · non-contrast
Comparison: Previous exam(s).

CLINICAL DATA: 27-year-old female with physician palpated lump in
the superior periareolar right breast. The patient also has areas of
intermittent pain, though nothing focal today.

EXAM:
ULTRASOUND OF THE RIGHT BREAST

[Series 1: ultrasound right breast limited · 0.07mm/px · 6 of 6 slices shown]
[im 1/6]
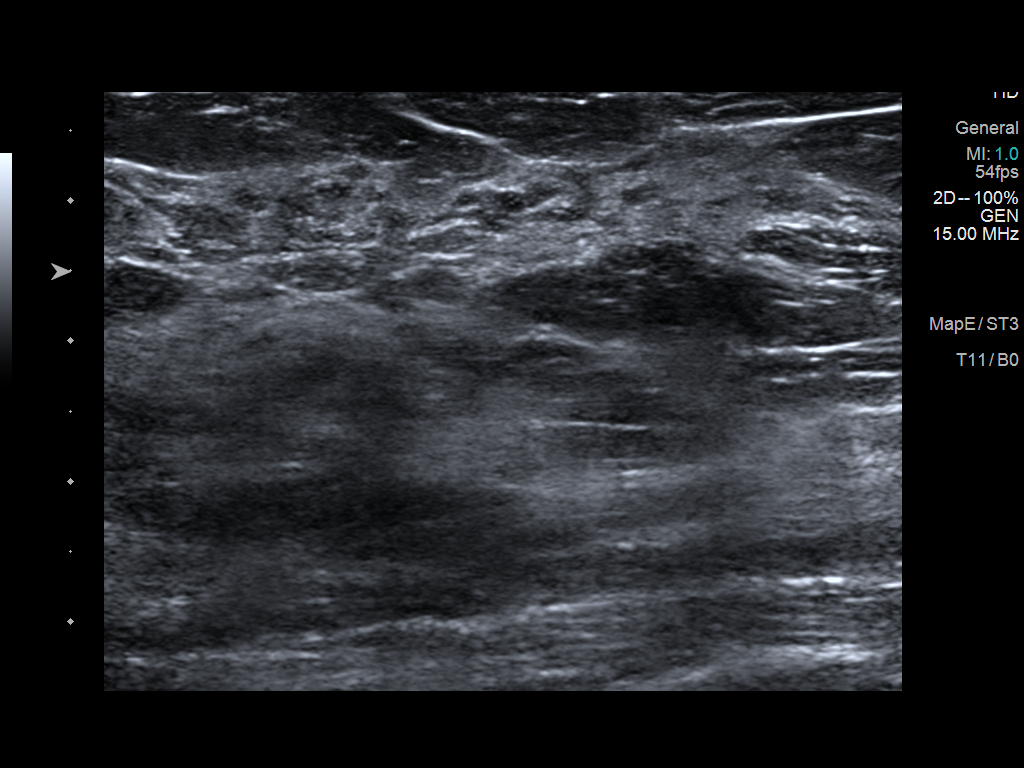
[im 2/6]
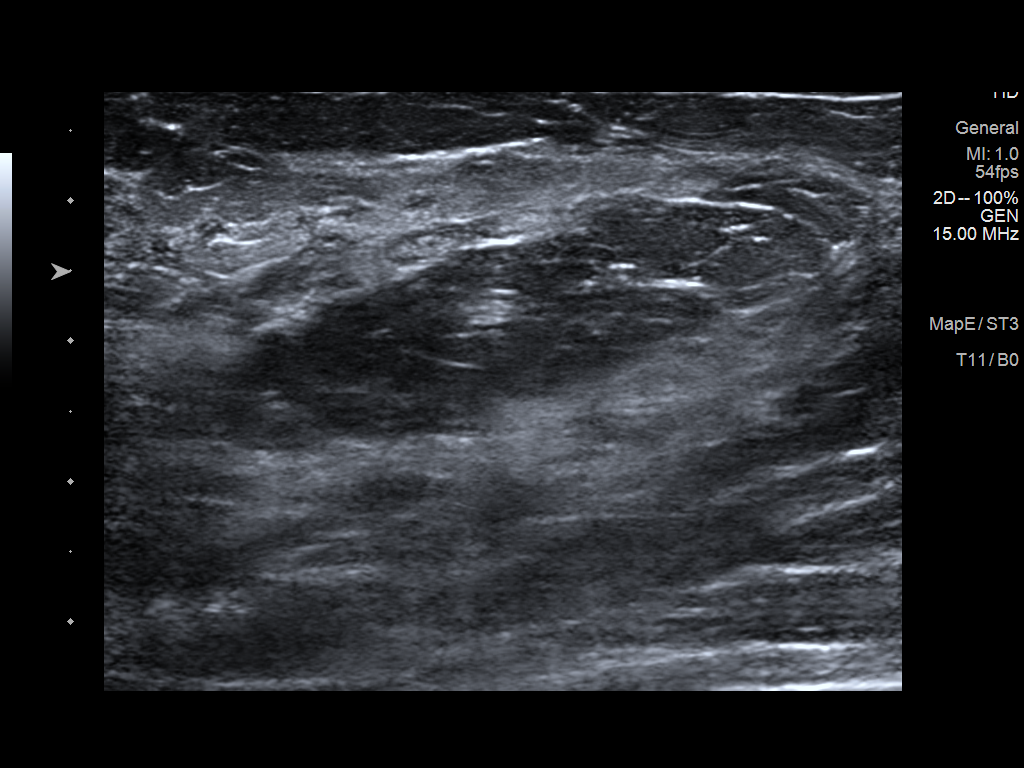
[im 3/6]
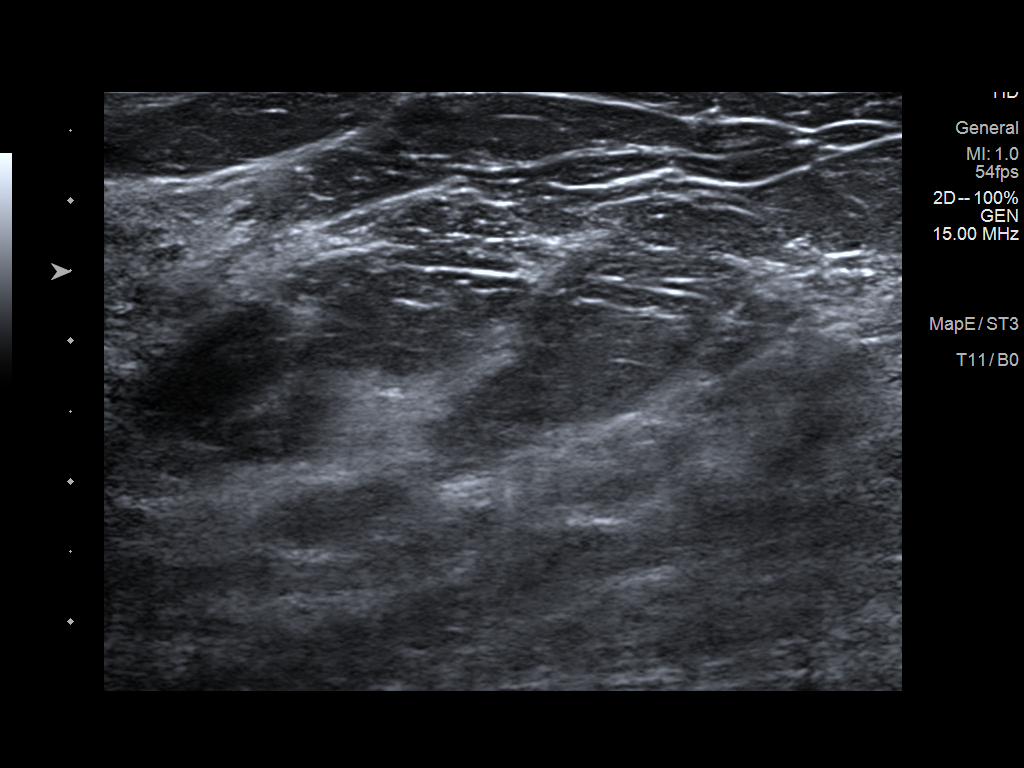
[im 4/6]
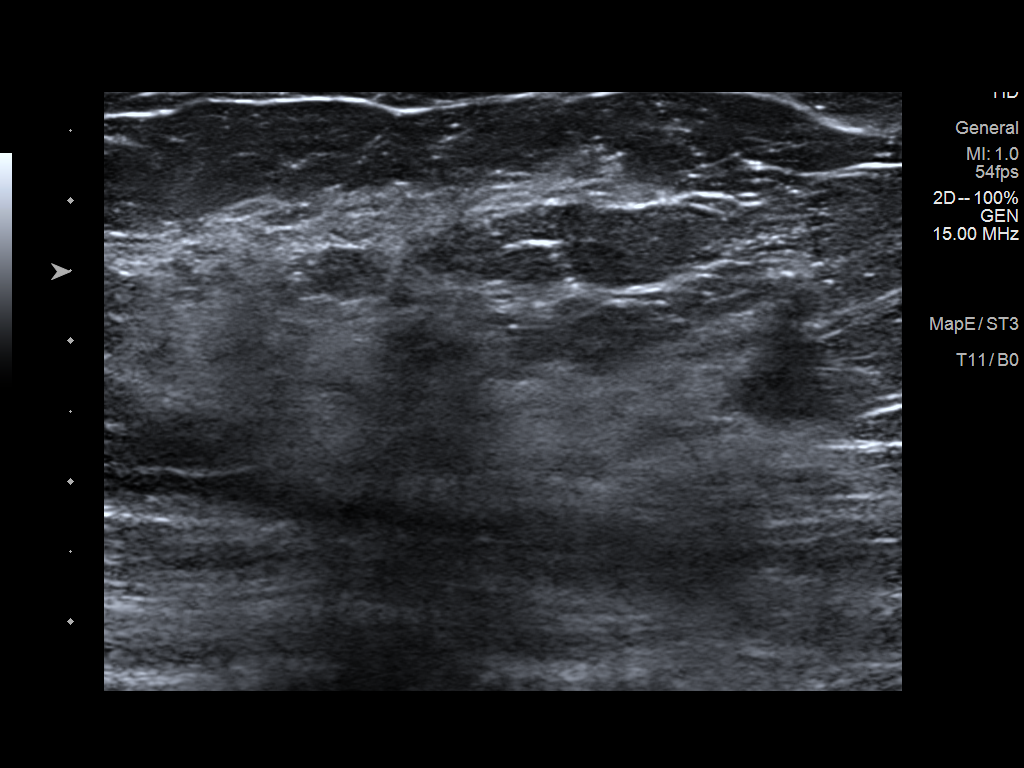
[im 5/6]
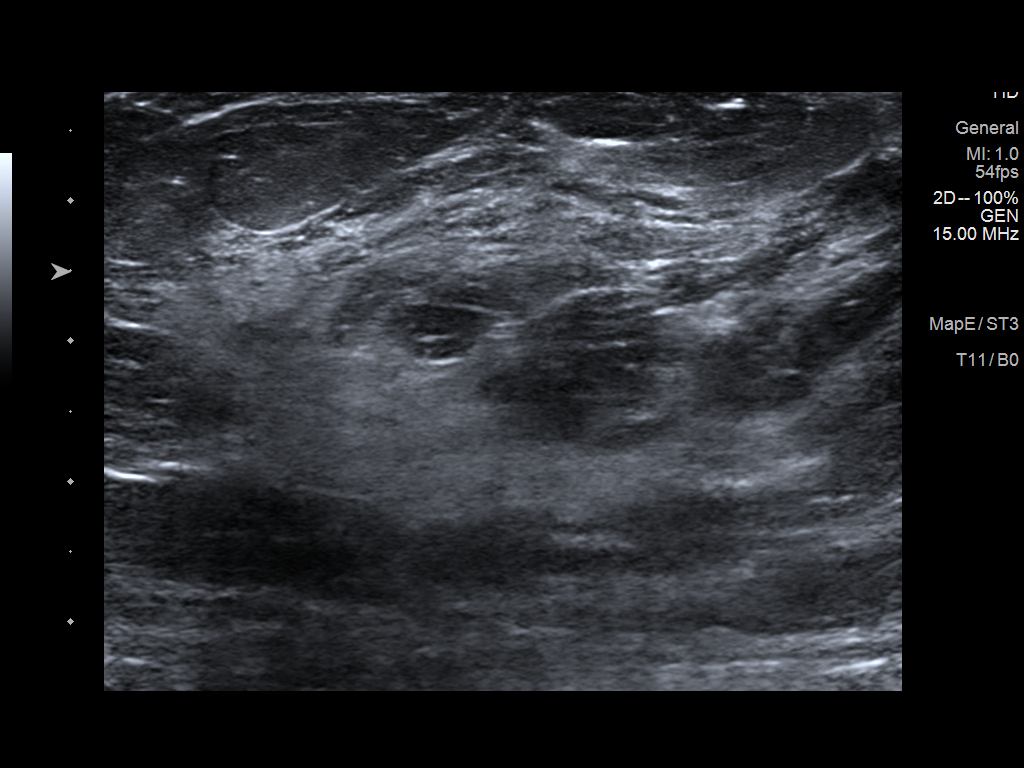
[im 6/6]
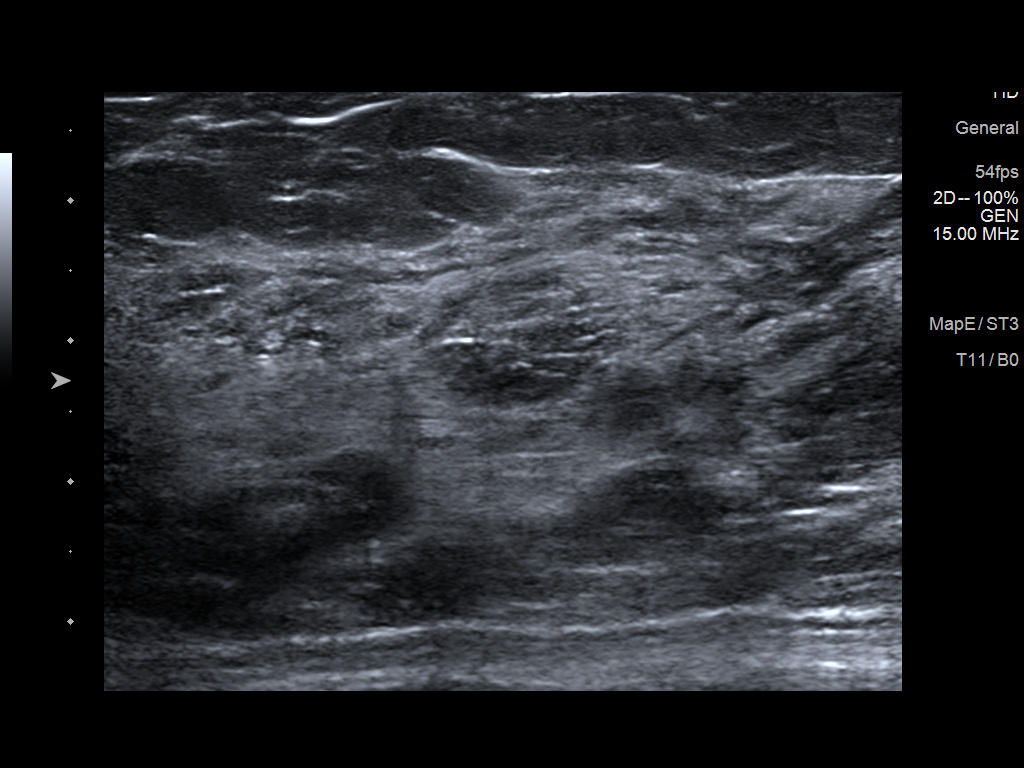

[6 of 6 positions shown; findings below may reference images not displayed]

FINDINGS: On physical exam, I palpate dense tissue without suspicious lump in
the upper right breast.

Targeted ultrasound is performed, showing normal fibroglandular
tissue without focal or suspicious sonographic finding. Evaluation
of the entire superior right breast was performed.
IMPRESSION: No sonographic evidence of malignancy.

RECOMMENDATION:
1. Clinical follow-up recommended for the palpable area of concern
in the right breast. Any further workup should be based on clinical
grounds.
2. Screening mammogram at age 40 unless there are persistent or
intervening clinical concerns. (Code:[id number]-2-292)

I have discussed the findings and recommendations with the patient.
Results were also provided in writing at the conclusion of the
visit. If applicable, a reminder letter will be sent to the patient
regarding the next appointment.

BI-RADS CATEGORY  1: Negative.

## 2019-04-25 ENCOUNTER — Other Ambulatory Visit: Payer: 59

## 2019-04-25 DIAGNOSIS — Z20828 Contact with and (suspected) exposure to other viral communicable diseases: Secondary | ICD-10-CM | POA: Diagnosis not present

## 2019-06-20 ENCOUNTER — Encounter: Payer: Self-pay | Admitting: Certified Nurse Midwife

## 2019-06-22 ENCOUNTER — Encounter: Payer: Self-pay | Admitting: Certified Nurse Midwife

## 2019-06-22 ENCOUNTER — Ambulatory Visit: Payer: Managed Care, Other (non HMO) | Admitting: Certified Nurse Midwife

## 2019-06-22 ENCOUNTER — Other Ambulatory Visit: Payer: Self-pay

## 2019-06-22 VITALS — BP 118/80 | HR 68 | Temp 98.8°F | Resp 16 | Wt 139.0 lb

## 2019-06-22 DIAGNOSIS — N912 Amenorrhea, unspecified: Secondary | ICD-10-CM | POA: Diagnosis not present

## 2019-06-22 DIAGNOSIS — Z3202 Encounter for pregnancy test, result negative: Secondary | ICD-10-CM | POA: Diagnosis not present

## 2019-06-22 LAB — POCT URINE PREGNANCY: Preg Test, Ur: NEGATIVE

## 2019-06-22 NOTE — Patient Instructions (Signed)

## 2019-06-22 NOTE — Progress Notes (Signed)
  29 y.o. hispanic female g0p0 presents with amenorrhea with + UPT on 06/22/2019. LMP 05/20/2019. Negative UPT here. Planned pregnancy. Complaining of  fatigue. Denies spotting or  Bleeding. Has noted slight cramping like menses would start, but has not. Medications she is taking RXV:QMGQQPYP vitamins only  . Patient has not consumed alcohol since + UPT. Spouse supportive and excited. Patient history of Chlamydia treated several years ago. Eating well and consuming good fluid intake. No other health issues today.  Review of Systems  Constitutional: Negative.   HENT: Negative.   Eyes: Negative.   Respiratory: Negative.   Cardiovascular: Negative.   Gastrointestinal: Negative.        Occasional cramping  Genitourinary: Negative.   Musculoskeletal: Negative.   Skin: Negative.   Neurological: Negative.   Endo/Heme/Allergies: Negative.   Psychiatric/Behavioral: Negative.     O: HPI pertinent to above. Healthy WDWN female Affect: normal, orientation x 3  Last Aex: 04/08/2018 Pap smear:03/24/17  negative    Rubella screen:03/24/17 positive immunity  A: Amenorrhea with positive UPT at home, negative UPT here  4 wk 5days per [facility] with [facility] 02/25/2020 Planned pregnancy History of Chlamydia treated several years ago   P: Reviewed with patient importance of prenatal care during pregnancy. Discussed negative UPT here, maybe due to late evening collection. Recommend serum HCG for confirmation. Patient agreeable. Reviewed recommendations assuming HCG will be positive. If not will discuss concerns. Given OB provider list. Reviewed nutrition importance of pregnancy and selecting from all food groups and making sure to have adequate protein intake daily. Discussed avoiding raw or exotic fish, soft cheeses due to risk of bacteria . Discussed concerns with FAS with alcohol use in pregnancy. Discussed increase of IUGR and SIDS with smoking use or second smoke. Reviewed warning signs of early pregnancy such as  cramping and bleeding and need to advise if occurs. Discussed comfort measures for early pregnancy changes. Offered viability PUS here prior to initiating prenatal care. Patient will have confirmation PUS for uterine pregnancy due to Chlamydia history.Marland Kitchen She will be called with insurance information and scheduled. Questions addressed at length.  Labs: Serum HCG  Rv prn   28 minutes Time spent with patient in face to face counseling regarding pregnancy and prenatal care

## 2019-06-23 ENCOUNTER — Telehealth: Payer: Self-pay | Admitting: *Deleted

## 2019-06-23 ENCOUNTER — Telehealth: Payer: Self-pay | Admitting: Certified Nurse Midwife

## 2019-06-23 DIAGNOSIS — N912 Amenorrhea, unspecified: Secondary | ICD-10-CM

## 2019-06-23 LAB — BETA HCG QUANT (REF LAB): hCG Quant: 165 m[IU]/mL

## 2019-06-23 NOTE — Telephone Encounter (Signed)
Patient sent the following correspondence through MyChart.  Hi Dr. Darcel Bayley,  I have a questions about the test results. Is it saying I am [redacted] weeks pregnant? Hope to hear back soon.   Thank you

## 2019-06-23 NOTE — Telephone Encounter (Signed)
-----   Message from Patricia Montgomery, CNM sent at 06/23/2019  9:59 AM EDT ----- Notify patient her HCG quant is positive within the 4 + week range. Would like for her to repeat in on Monday to make sure the level is increases. Please schedule.

## 2019-06-23 NOTE — Telephone Encounter (Signed)
Leda Min, RN  06/23/2019 12:39 PM EDT    Left message to call Noreene Larsson, RN at [facility] [phone].

## 2019-06-23 NOTE — Telephone Encounter (Signed)
Spoke with patient, advised as seen below per Leota Sauers, CNM. Lab appt scheduled for 3/22 at 8:45am. Patient verbalizes understanding and is agreeable.   Future lab order placed.   Encounter closed.

## 2019-06-23 NOTE — Telephone Encounter (Signed)
Patient is calling regarding test results seen on MyChart.

## 2019-06-23 NOTE — Telephone Encounter (Signed)
See telephone encounter dated 06/23/19.   Encounter closed.

## 2019-06-27 ENCOUNTER — Other Ambulatory Visit: Payer: Self-pay

## 2019-06-27 ENCOUNTER — Other Ambulatory Visit (INDEPENDENT_AMBULATORY_CARE_PROVIDER_SITE_OTHER): Payer: Managed Care, Other (non HMO)

## 2019-06-27 DIAGNOSIS — Z3201 Encounter for pregnancy test, result positive: Secondary | ICD-10-CM

## 2019-06-27 DIAGNOSIS — N912 Amenorrhea, unspecified: Secondary | ICD-10-CM

## 2019-06-27 LAB — BETA HCG QUANT (REF LAB): hCG Quant: 1309 m[IU]/mL

## 2019-06-27 NOTE — Progress Notes (Signed)
Orders placed for PUS for viability per Ortencia Kick, CNM.   Hayley to precert.

## 2019-06-28 NOTE — Addendum Note (Signed)
Addended by: Isabell Jarvis on: 06/28/2019 09:39 AM   Modules accepted: Orders

## 2019-07-06 ENCOUNTER — Other Ambulatory Visit: Payer: Self-pay

## 2019-07-06 DIAGNOSIS — N912 Amenorrhea, unspecified: Secondary | ICD-10-CM

## 2019-07-06 DIAGNOSIS — Z3201 Encounter for pregnancy test, result positive: Secondary | ICD-10-CM

## 2019-07-07 ENCOUNTER — Other Ambulatory Visit: Payer: Self-pay | Admitting: Obstetrics and Gynecology

## 2019-07-07 ENCOUNTER — Other Ambulatory Visit: Payer: Self-pay

## 2019-07-07 ENCOUNTER — Ambulatory Visit (INDEPENDENT_AMBULATORY_CARE_PROVIDER_SITE_OTHER): Payer: Managed Care, Other (non HMO) | Admitting: Obstetrics and Gynecology

## 2019-07-07 ENCOUNTER — Ambulatory Visit (INDEPENDENT_AMBULATORY_CARE_PROVIDER_SITE_OTHER): Payer: Managed Care, Other (non HMO)

## 2019-07-07 ENCOUNTER — Encounter: Payer: Self-pay | Admitting: Obstetrics and Gynecology

## 2019-07-07 VITALS — BP 110/60 | HR 70 | Temp 98.4°F | Ht 58.75 in | Wt 140.0 lb

## 2019-07-07 DIAGNOSIS — Z3201 Encounter for pregnancy test, result positive: Secondary | ICD-10-CM

## 2019-07-07 DIAGNOSIS — Z3687 Encounter for antenatal screening for uncertain dates: Secondary | ICD-10-CM | POA: Diagnosis not present

## 2019-07-07 DIAGNOSIS — Z3490 Encounter for supervision of normal pregnancy, unspecified, unspecified trimester: Secondary | ICD-10-CM

## 2019-07-07 DIAGNOSIS — N912 Amenorrhea, unspecified: Secondary | ICD-10-CM | POA: Diagnosis not present

## 2019-07-07 NOTE — Progress Notes (Signed)
GYNECOLOGY  VISIT   HPI: 29 y.o.   Married  Hispanic  female   G0P0000 with Patient's last menstrual period was 05/20/2019 (exact date).   here for   Early pregnancy [place].   Feeling well.   HCG 1309 on 06/27/19.  Prior HCG 165 on 06/22/19.   Plans to receive care at [facility].   GYNECOLOGIC HISTORY: Patient's last menstrual period was 05/20/2019 (exact date). Contraception:  none Menopausal hormone therapy:  n/a Last mammogram:  n/a Last pap smear: 03-24-17 Neg, 11-24-13 Neg        OB History    Gravida  0   Para  0   Term  0   Preterm  0   AB  0   Living  0     SAB  0   TAB  0   Ectopic  0   Multiple  0   Live Births                 There are no problems to display for this patient.   Past Medical History:  Diagnosis Date  . Chlamydia contact, treated    7/11, 10/11    History reviewed. No pertinent surgical history.  Current Outpatient Medications  Medication Sig Dispense Refill  . Nutritional Supplements (DHEA PO) Take 1 tablet by mouth daily.    Marland Kitchen OVER THE COUNTER MEDICATION Prenatal D3 Takes 1 tablet daily    . Prenatal Vit-Fe Fumarate-FA (PRENATAL VITAMIN PO) Take by mouth.     No current facility-administered medications for this visit.     ALLERGIES: Patient has no known allergies.  Family History  Problem Relation Age of Onset  . Diabetes Maternal Grandmother   . Heart Problems Maternal Grandmother   . Hypertension Maternal Grandmother     Social History   Socioeconomic History  . Marital status: Married    Spouse name: Not on file  . Number of children: Not on file  . Years of education: Not on file  . Highest education level: Not on file  Occupational History  . Not on file  Tobacco Use  . Smoking status: Never Smoker  . Smokeless tobacco: Never Used  Substance and Sexual Activity  . Alcohol use: No    Alcohol/week: 0.0 standard drinks  . Drug use: No  . Sexual activity: Yes    Partners: Male    Birth  control/protection: None  Other Topics Concern  . Not on file  Social History Narrative  . Not on file   Social Determinants of Health   Financial Resource Strain:   . Difficulty of Paying Living Expenses:   Food Insecurity:   . Worried About Programme researcher, broadcasting/film/video in the Last Year:   . Barista in the Last Year:   Transportation Needs:   . Freight forwarder (Medical):   Marland Kitchen Lack of Transportation (Non-Medical):   Physical Activity:   . Days of Exercise per Week:   . Minutes of Exercise per Session:   Stress:   . Feeling of Stress :   Social Connections:   . Frequency of Communication with Friends and Family:   . Frequency of Social Gatherings with Friends and Family:   . Attends Religious Services:   . Active Member of Clubs or Organizations:   . Attends Banker Meetings:   Marland Kitchen Marital Status:   Intimate Partner Violence:   . Fear of Current or Ex-Partner:   . Emotionally Abused:   .  Physically Abused:   . Sexually Abused:     Review of Systems  All other systems reviewed and are negative.   PHYSICAL EXAMINATION:    BP 110/60   Pulse 70   Temp 98.4 F (36.9 C) (Temporal)   Ht 4' 10.75" (1.492 m)   Wt 140 lb (63.5 kg)   LMP 05/20/2019 (Exact Date)   BMI 28.Patricia kg/m     General appearance: alert, cooperative and appears stated age   OB US IUP at 5+5 weeks.  FH 103.  Cervix long nad closed.  Small left CL.  Normal right ovary. No free fluid.   ASSESSMENT  Early viable IUP.   PLAN  Congratuations on pregnancy! OB ultrasound findings reviewed.  Questions invited and answered. She will continue her care now with her OB team.    An After Visit Summary was printed and given to the patient.

## 2019-07-26 LAB — OB RESULTS CONSOLE ABO/RH: RH Type: POSITIVE

## 2019-07-26 LAB — OB RESULTS CONSOLE HEPATITIS B SURFACE ANTIGEN: Hepatitis B Surface Ag: NEGATIVE

## 2019-07-26 LAB — OB RESULTS CONSOLE HIV ANTIBODY (ROUTINE TESTING): HIV: NONREACTIVE

## 2019-07-26 LAB — OB RESULTS CONSOLE ANTIBODY SCREEN: Antibody Screen: NEGATIVE

## 2019-07-26 LAB — OB RESULTS CONSOLE RPR: RPR: NONREACTIVE

## 2019-07-26 LAB — OB RESULTS CONSOLE RUBELLA ANTIBODY, IGM: Rubella: IMMUNE

## 2019-10-17 ENCOUNTER — Encounter (HOSPITAL_COMMUNITY): Payer: Self-pay | Admitting: Emergency Medicine

## 2019-10-17 ENCOUNTER — Inpatient Hospital Stay (HOSPITAL_COMMUNITY)
Admission: EM | Admit: 2019-10-17 | Discharge: 2019-10-18 | Disposition: A | Discharge status: Home / Self Care | Payer: Managed Care, Other (non HMO) | Attending: Emergency Medicine | Admitting: Emergency Medicine

## 2019-10-17 ENCOUNTER — Other Ambulatory Visit: Payer: Self-pay

## 2019-10-17 DIAGNOSIS — Z0371 Encounter for suspected problem with amniotic cavity and membrane ruled out: Secondary | ICD-10-CM

## 2019-10-17 DIAGNOSIS — Z3A20 20 weeks gestation of pregnancy: Secondary | ICD-10-CM | POA: Diagnosis not present

## 2019-10-17 DIAGNOSIS — N898 Other specified noninflammatory disorders of vagina: Secondary | ICD-10-CM | POA: Diagnosis not present

## 2019-10-17 DIAGNOSIS — O23592 Infection of other part of genital tract in pregnancy, second trimester: Secondary | ICD-10-CM | POA: Insufficient documentation

## 2019-10-17 LAB — I-STAT BETA HCG BLOOD, ED (MC, WL, AP ONLY): I-stat hCG, quantitative: >2000 m[IU]/mL — ABNORMAL HIGH (ref <5)

## 2019-10-17 NOTE — ED Triage Notes (Signed)
Patient reports clear vaginal discharge this evening , she is [redacted] weeks pregnant G1P0 , denies abdominal contractions .

## 2019-10-17 NOTE — ED Notes (Signed)
Patient evaluated by PA at triage room .

## 2019-10-17 NOTE — ED Provider Notes (Signed)
[facility] EMERGENCY DEPARTMENT Provider Note   CSN: [id number] Arrival date & time: 10/17/19  2244     History Chief Complaint  Patient presents with  . Vaginal Discharge    [redacted] weeks pregnant     Patricia Montgomery is a 29 y.o. female.  29 year old female reports vaginal discharge- clear liquid "like I peed myself" after getting out of the shower around 9:40PM. Laid down down on the sofa and reports additional leaking of water, not bloody. Denies any cramping, reports tightness. G1P0, prenatal care with [facility], LMP 05/14/19, reports [redacted] weeks gestation.         Past Medical History:  Diagnosis Date  . Chlamydia contact, treated    7/11, 10/11    There are no problems to display for this patient.   History reviewed. No pertinent surgical history.   OB History    Gravida  1   Para  0   Term  0   Preterm  0   AB  0   Living  0     SAB  0   TAB  0   Ectopic  0   Multiple  0   Live Births              Family History  Problem Relation Age of Onset  . Diabetes Maternal Grandmother   . Heart Problems Maternal Grandmother   . Hypertension Maternal Grandmother     Social History   Tobacco Use  . Smoking status: Never Smoker  . Smokeless tobacco: Never Used  Substance Use Topics  . Alcohol use: No    Alcohol/week: 0.0 standard drinks  . Drug use: No    Home Medications Prior to Admission medications   Medication Sig Start Date End Date Taking? Authorizing Provider  Prenatal Vit-Fe Fumarate-FA (PRENATAL VITAMIN PO) Take by mouth.   Yes [provider]  Nutritional Supplements (DHEA PO) Take 1 tablet by mouth daily.    [provider]  OVER THE COUNTER MEDICATION Prenatal D3 Takes 1 tablet daily    [provider]    Allergies    Patient has no known allergies.  Review of Systems   Review of Systems  Constitutional: Negative for fever.  Gastrointestinal: Negative for abdominal  pain, nausea and vomiting.  Genitourinary: Positive for vaginal discharge.    Physical Exam Updated Vital Signs BP 123/70 (BP Location: Left Arm)   Pulse 85   Temp 98.1 F (36.7 C) (Oral)   Resp 18   Ht 4\' 11"  (1.499 m)   Wt 68.9 kg   LMP 05/14/2019   SpO2 100%   BMI 30.66 kg/m   Physical Exam Vitals and nursing note reviewed.  Constitutional:      General: She is not in acute distress.    Appearance: She is well-developed. She is not diaphoretic.  HENT:     Head: Normocephalic and atraumatic.  Pulmonary:     Effort: Pulmonary effort is normal.  Abdominal:     Tenderness: There is no abdominal tenderness.     Comments: gravid  Skin:    General: Skin is warm and dry.  Neurological:     Mental Status: She is alert and oriented to person, place, and time.  Psychiatric:        Behavior: Behavior normal.     ED Results / Procedures / Treatments   Labs (all labs ordered are listed, but only abnormal results are displayed) Labs Reviewed  WET  PREP, GENITAL - Abnormal; Notable for the following components:      Result Value   WBC, Wet Prep HPF POC MANY (*)    All other components within normal limits  CBC WITH DIFFERENTIAL/PLATELET - Abnormal; Notable for the following components:   RBC 3.57 (*)    Hemoglobin 10.9 (*)    HCT 33.2 (*)    Abs Immature Granulocytes 0.13 (*)    All other components within normal limits  BASIC METABOLIC PANEL - Abnormal; Notable for the following components:   Glucose, Bld 106 (*)    All other components within normal limits  URINALYSIS, ROUTINE W REFLEX MICROSCOPIC - Abnormal; Notable for the following components:   Color, Urine COLORLESS (*)    Specific Gravity, Urine 1.004 (*)    All other components within normal limits  I-STAT BETA HCG BLOOD, ED ([facility], [facility], [facility] ONLY) - Abnormal; Notable for the following components:   I-stat hCG, quantitative >2,000.0 (*)    All other components within normal limits  AMNISURE RUPTURE OF MEMBRANE  (ROM) NOT AT [facility]  POCT FERN TEST    EKG None  Radiology No results found.  Procedures Procedures (including critical care time)  Medications Ordered in ED Medications - No data to display  ED Course  I have reviewed the triage vital signs and the nursing notes.  Pertinent labs & imaging results that were available during my care of the patient were reviewed by me and considered in my medical decision making (see chart for details).  Clinical Course as of Oct 17 129  Tue Oct 18, 2019  3858 29 year old female reportedly [redacted] weeks pregnant with report of watery discharge from the vagina after taking a shower today.  On exam, no distress, vitals stable, abdomen gravid and nontender.  Case discussed with MAU APP who accepts patient for evaluation to the MAU.   [LM]    Clinical Course User Index [LM] Alden Hipp   MDM Rules/Calculators/A&P                          Final Clinical Impression(s) / ED Diagnoses Final diagnoses:  None    Rx / DC Orders ED Discharge Orders    None       Jeannie Fend, PA-C 10/18/19 0131    Dione Booze, MD 10/18/19 [phone]

## 2019-10-18 ENCOUNTER — Encounter (HOSPITAL_COMMUNITY): Payer: Self-pay | Admitting: Obstetrics & Gynecology

## 2019-10-18 DIAGNOSIS — Z0371 Encounter for suspected problem with amniotic cavity and membrane ruled out: Secondary | ICD-10-CM

## 2019-10-18 DIAGNOSIS — Z3A20 20 weeks gestation of pregnancy: Secondary | ICD-10-CM

## 2019-10-18 LAB — URINALYSIS, ROUTINE W REFLEX MICROSCOPIC
Bilirubin Urine: NEGATIVE
Glucose, UA: NEGATIVE mg/dL
Hgb urine dipstick: NEGATIVE
Ketones, ur: NEGATIVE mg/dL
Leukocytes,Ua: NEGATIVE
Nitrite: NEGATIVE
Protein, ur: NEGATIVE mg/dL
Specific Gravity, Urine: 1.004 — ABNORMAL LOW (ref 1.005–1.030)
pH: 7 (ref 5.0–8.0)

## 2019-10-18 LAB — BASIC METABOLIC PANEL
Anion gap: 10 (ref 5–15)
BUN: 6 mg/dL (ref 6–20)
CO2: 22 mmol/L (ref 22–32)
Calcium: 9.4 mg/dL (ref 8.9–10.3)
Chloride: 106 mmol/L (ref 98–111)
Creatinine, Ser: 0.48 mg/dL (ref 0.44–1.00)
GFR calc Af Amer: >60 mL/min (ref >60)
GFR calc non Af Amer: >60 mL/min (ref >60)
Glucose, Bld: 106 mg/dL — ABNORMAL HIGH (ref 70–99)
Potassium: 3.9 mmol/L (ref 3.5–5.1)
Sodium: 138 mmol/L (ref 135–145)

## 2019-10-18 LAB — WET PREP, GENITAL
Clue Cells Wet Prep HPF POC: NONE SEEN
Sperm: NONE SEEN
Trich, Wet Prep: NONE SEEN
Yeast Wet Prep HPF POC: NONE SEEN

## 2019-10-18 LAB — CBC WITH DIFFERENTIAL/PLATELET
Abs Immature Granulocytes: 0.13 10*3/uL — ABNORMAL HIGH (ref 0.00–0.07)
Basophils Absolute: 0 10*3/uL (ref 0.0–0.1)
Basophils Relative: 0 %
Eosinophils Absolute: 0.2 10*3/uL (ref 0.0–0.5)
Eosinophils Relative: 2 %
HCT: 33.2 % — ABNORMAL LOW (ref 36.0–46.0)
Hemoglobin: 10.9 g/dL — ABNORMAL LOW (ref 12.0–15.0)
Immature Granulocytes: 1 %
Lymphocytes Relative: 24 %
Lymphs Abs: 2.5 10*3/uL (ref 0.7–4.0)
MCH: 30.5 pg (ref 26.0–34.0)
MCHC: 32.8 g/dL (ref 30.0–36.0)
MCV: 93 fL (ref 80.0–100.0)
Monocytes Absolute: 0.8 10*3/uL (ref 0.1–1.0)
Monocytes Relative: 8 %
Neutro Abs: 6.6 10*3/uL (ref 1.7–7.7)
Neutrophils Relative %: 65 %
Platelets: 279 10*3/uL (ref 150–400)
RBC: 3.57 MIL/uL — ABNORMAL LOW (ref 3.87–5.11)
RDW: 12.2 % (ref 11.5–15.5)
WBC: 10.2 10*3/uL (ref 4.0–10.5)
nRBC: 0 % (ref 0.0–0.2)

## 2019-10-18 LAB — AMNISURE RUPTURE OF MEMBRANE (ROM) NOT AT ARMC: Amnisure ROM: NEGATIVE

## 2019-10-18 NOTE — MAU Note (Signed)
PT SAYS AT 940PM- GOT OUT OF SHOWER- DRIED - THEN WATER RAN CAME OUT ONTO FLOOR- CLEAR - THEN WHILE ON SOFA- - MORE FLUID CAME OUT . SAYS FEELS SOME TIGHTENING IN ABD . LAST SEX- MARCH. PNC-WITH DR MODY

## 2019-10-18 NOTE — MAU Provider Note (Signed)
S: Patricia Montgomery is a 29 y.o. G1P0000 at [redacted]w[redacted]d  who presents to MAU today as a transfer from [facility] complaining of leaking of fluid since around 2140. Patient reports getting out of shower, dried off then a gush of fluid came out onto the floor that was clear, "gush was a lot and came out like pee". She denies continued leaking or having to wear pad or panty liner for LOF. She denies vaginal bleeding. She denies contractions. She reports that she was having some tightening around 2230 but it has since resolved. She reports normal fetal movement.    O: BP 123/70 (BP Location: Left Arm)   Pulse 85   Temp 98.1 F (36.7 C) (Oral)   Resp 18   Ht 4\' 11"  (1.499 m)   Wt 68.9 kg   LMP 05/14/2019   SpO2 100%   BMI 30.66 kg/m  GENERAL: Well-developed, well-nourished female in no acute distress.  HEAD: Normocephalic, atraumatic.  CHEST: Normal effort of breathing, regular heart rate ABDOMEN: Soft, nontender, gravid PELVIC: Normal external female genitalia. Vagina is pink and rugated. Cervix with normal contour, no lesions. Normal discharge.  negative pooling. Cervix closed on speculum examination.   Fetal Monitoring: FHR 142 by doppler   Results for orders placed or performed during the hospital encounter of 10/17/19 (from the past 24 hour(s))  Urinalysis, Routine w reflex microscopic     Status: Abnormal   Collection Time: 10/17/19 11:01 PM  Result Value Ref Range   Color, Urine COLORLESS (A) YELLOW   APPearance CLEAR CLEAR   Specific Gravity, Urine 1.004 (L) 1.005 - 1.030   pH 7.0 5.0 - 8.0   Glucose, UA NEGATIVE NEGATIVE mg/dL   Hgb urine dipstick NEGATIVE NEGATIVE   Bilirubin Urine NEGATIVE NEGATIVE   Ketones, ur NEGATIVE NEGATIVE mg/dL   Protein, ur NEGATIVE NEGATIVE mg/dL   Nitrite NEGATIVE NEGATIVE   Leukocytes,Ua NEGATIVE NEGATIVE  I-Stat Beta hCG blood, ED ([facility], [facility], [facility] only)     Status: Abnormal   Collection Time: 10/17/19 11:03 PM  Result Value Ref Range   I-stat hCG,  quantitative >2,000.0 (H) <5 mIU/mL   Comment 3          CBC with Differential     Status: Abnormal   Collection Time: 10/17/19 11:10 PM  Result Value Ref Range   WBC 10.2 4.0 - 10.5 K/uL   RBC 3.57 (L) 3.87 - 5.11 MIL/uL   Hemoglobin 10.9 (L) 12.0 - 15.0 g/dL   HCT 12/18/19 (L) 36 - 46 %   MCV 93.0 80.0 - 100.0 fL   MCH 30.5 26.0 - 34.0 pg   MCHC 32.8 30.0 - 36.0 g/dL   RDW 40.9 81.1 - 91.4 %   Platelets 279 150 - 400 K/uL   nRBC 0.0 0.0 - 0.2 %   Neutrophils Relative % 65 %   Neutro Abs 6.6 1.7 - 7.7 K/uL   Lymphocytes Relative 24 %   Lymphs Abs 2.5 0.7 - 4.0 K/uL   Monocytes Relative 8 %   Monocytes Absolute 0.8 0 - 1 K/uL   Eosinophils Relative 2 %   Eosinophils Absolute 0.2 0 - 0 K/uL   Basophils Relative 0 %   Basophils Absolute 0.0 0 - 0 K/uL   Immature Granulocytes 1 %   Abs Immature Granulocytes 0.13 (H) 0.00 - 0.07 K/uL  Basic metabolic panel     Status: Abnormal   Collection Time: 10/17/19 11:10 PM  Result Value Ref Range  Sodium 138 135 - 145 mmol/L   Potassium 3.9 3.5 - 5.1 mmol/L   Chloride 106 98 - 111 mmol/L   CO2 22 22 - 32 mmol/L   Glucose, Bld 106 (H) 70 - 99 mg/dL   BUN 6 6 - 20 mg/dL   Creatinine, Ser 8.89 0.44 - 1.00 mg/dL   Calcium 9.4 8.9 - 16.9 mg/dL   GFR calc non Af Amer >60 >60 mL/min   GFR calc Af Amer >60 >60 mL/min   Anion gap 10 5 - 15  Amnisure rupture of membrane (rom)not at [facility]     Status: None   Collection Time: 10/18/19  1:03 AM  Result Value Ref Range   Amnisure ROM NEGATIVE   Wet prep, genital     Status: Abnormal   Collection Time: 10/18/19  1:03 AM  Result Value Ref Range   Yeast Wet Prep HPF POC NONE SEEN NONE SEEN   Trich, Wet Prep NONE SEEN NONE SEEN   Clue Cells Wet Prep HPF POC NONE SEEN NONE SEEN   WBC, Wet Prep HPF POC MANY (A) NONE SEEN   Sperm NONE SEEN    Fern negative   Discussed with patient results of fern, amnisure and wet prep. Encouraged patient that her membranes are not ruptured at this time. Discussed  warning signs, preterm labor precautions and reasons to present back to MAU.   Follow up as scheduled in the office. Return to MAU as needed. Pt stable at time of discharge.   A: SIUP at [redacted]w[redacted]d  Membranes intact  P: Discharge home Follow up as scheduled in the office for prenatal care Return to MAU as needed for reasons discussed and/or emergencies    Sharyon Cable, CNM 10/18/2019 1:38 AM

## 2020-02-07 LAB — OB RESULTS CONSOLE GBS: GBS: POSITIVE

## 2020-02-21 ENCOUNTER — Telehealth (HOSPITAL_COMMUNITY): Payer: Self-pay | Admitting: *Deleted

## 2020-02-21 NOTE — Telephone Encounter (Signed)
Preadmission screen  

## 2020-02-22 ENCOUNTER — Telehealth (HOSPITAL_COMMUNITY): Payer: Self-pay | Admitting: *Deleted

## 2020-02-22 NOTE — Telephone Encounter (Signed)
Preadmission screen  

## 2020-02-24 ENCOUNTER — Telehealth (HOSPITAL_COMMUNITY): Payer: Self-pay | Admitting: *Deleted

## 2020-02-24 ENCOUNTER — Encounter (HOSPITAL_COMMUNITY): Payer: Self-pay | Admitting: *Deleted

## 2020-02-24 NOTE — Telephone Encounter (Signed)
Preadmission screen  

## 2020-02-28 ENCOUNTER — Inpatient Hospital Stay (HOSPITAL_COMMUNITY): Payer: Managed Care, Other (non HMO) | Admitting: Anesthesiology

## 2020-02-28 ENCOUNTER — Other Ambulatory Visit: Payer: Self-pay

## 2020-02-28 ENCOUNTER — Inpatient Hospital Stay (HOSPITAL_COMMUNITY)
Admission: AD | Admit: 2020-02-28 | Discharge: 2020-03-02 | DRG: 786 | Disposition: A | Discharge status: Home / Self Care | Payer: Managed Care, Other (non HMO) | Attending: Obstetrics | Admitting: Obstetrics

## 2020-02-28 DIAGNOSIS — Z3A39 39 weeks gestation of pregnancy: Secondary | ICD-10-CM | POA: Diagnosis not present

## 2020-02-28 DIAGNOSIS — K661 Hemoperitoneum: Secondary | ICD-10-CM | POA: Diagnosis not present

## 2020-02-28 DIAGNOSIS — Z20822 Contact with and (suspected) exposure to covid-19: Secondary | ICD-10-CM | POA: Diagnosis present

## 2020-02-28 DIAGNOSIS — D62 Acute posthemorrhagic anemia: Secondary | ICD-10-CM | POA: Diagnosis not present

## 2020-02-28 DIAGNOSIS — O4292 Full-term premature rupture of membranes, unspecified as to length of time between rupture and onset of labor: Secondary | ICD-10-CM | POA: Diagnosis present

## 2020-02-28 DIAGNOSIS — O3663X0 Maternal care for excessive fetal growth, third trimester, not applicable or unspecified: Secondary | ICD-10-CM | POA: Diagnosis present

## 2020-02-28 DIAGNOSIS — O99824 Streptococcus B carrier state complicating childbirth: Secondary | ICD-10-CM | POA: Diagnosis present

## 2020-02-28 DIAGNOSIS — O9081 Anemia of the puerperium: Secondary | ICD-10-CM | POA: Diagnosis not present

## 2020-02-28 DIAGNOSIS — O4290 Premature rupture of membranes, unspecified as to length of time between rupture and onset of labor, unspecified weeks of gestation: Secondary | ICD-10-CM | POA: Diagnosis present

## 2020-02-28 LAB — CBC
HCT: 36.2 % (ref 36.0–46.0)
Hemoglobin: 11.6 g/dL — ABNORMAL LOW (ref 12.0–15.0)
MCH: 28.8 pg (ref 26.0–34.0)
MCHC: 32 g/dL (ref 30.0–36.0)
MCV: 89.8 fL (ref 80.0–100.0)
Platelets: 230 10*3/uL (ref 150–400)
RBC: 4.03 MIL/uL (ref 3.87–5.11)
RDW: 14 % (ref 11.5–15.5)
WBC: 8.8 10*3/uL (ref 4.0–10.5)
nRBC: 0 % (ref 0.0–0.2)

## 2020-02-28 LAB — RESPIRATORY PANEL BY RT PCR (FLU A&B, COVID)
Influenza A by PCR: NEGATIVE
Influenza B by PCR: NEGATIVE
SARS Coronavirus 2 by RT PCR: NEGATIVE

## 2020-02-28 MED ORDER — OXYTOCIN-SODIUM CHLORIDE 30-0.9 UT/500ML-% IV SOLN: 2.5000 [IU]/h | INTRAVENOUS | Status: DC

## 2020-02-28 MED ORDER — LACTATED RINGERS IV SOLN
500.0000 mL | Freq: Once | INTRAVENOUS | Status: AC
  Administered 2020-02-28: 500 mL via INTRAVENOUS

## 2020-02-28 MED ORDER — SODIUM CHLORIDE 0.9 % IV SOLN
5.0000 10*6.[IU] | Freq: Once | INTRAVENOUS | Status: AC
  Administered 2020-02-28: 5 10*6.[IU] via INTRAVENOUS
  Filled 2020-02-28: qty 5

## 2020-02-28 MED ORDER — OXYCODONE-ACETAMINOPHEN 5-325 MG PO TABS: 2.0000 | ORAL_TABLET | ORAL | Status: DC | PRN

## 2020-02-28 MED ORDER — LIDOCAINE HCL (PF) 1 % IJ SOLN: 30.0000 mL | INTRAMUSCULAR | Status: DC | PRN

## 2020-02-28 MED ORDER — TERBUTALINE SULFATE 1 MG/ML IJ SOLN: 0.2500 mg | Freq: Once | INTRAMUSCULAR | Status: DC | PRN

## 2020-02-28 MED ORDER — LACTATED RINGERS IV SOLN: INTRAVENOUS | Status: DC

## 2020-02-28 MED ORDER — PHENYLEPHRINE 40 MCG/ML (10ML) SYRINGE FOR IV PUSH (FOR BLOOD PRESSURE SUPPORT)
80.0000 ug | PREFILLED_SYRINGE | INTRAVENOUS | Status: AC | PRN
  Administered 2020-02-28 (×3): 80 ug via INTRAVENOUS
  Filled 2020-02-28: qty 10

## 2020-02-28 MED ORDER — LACTATED RINGERS IV SOLN
500.0000 mL | INTRAVENOUS | Status: DC | PRN
  Administered 2020-02-28: 1000 mL via INTRAVENOUS

## 2020-02-28 MED ORDER — ACETAMINOPHEN 325 MG PO TABS: 650.0000 mg | ORAL_TABLET | ORAL | Status: DC | PRN

## 2020-02-28 MED ORDER — SODIUM CHLORIDE (PF) 0.9 % IJ SOLN
INTRAMUSCULAR | Status: DC | PRN
  Administered 2020-02-28: 12 mL/h via EPIDURAL

## 2020-02-28 MED ORDER — SODIUM CHLORIDE 0.9 % IV SOLN: 5.0000 10*6.[IU] | Freq: Once | INTRAVENOUS | Status: DC

## 2020-02-28 MED ORDER — PENICILLIN G POT IN DEXTROSE 60000 UNIT/ML IV SOLN: 3.0000 10*6.[IU] | INTRAVENOUS | Status: DC

## 2020-02-28 MED ORDER — EPHEDRINE 5 MG/ML INJ: 10.0000 mg | INTRAVENOUS | Status: DC | PRN

## 2020-02-28 MED ORDER — OXYCODONE-ACETAMINOPHEN 5-325 MG PO TABS: 1.0000 | ORAL_TABLET | ORAL | Status: DC | PRN

## 2020-02-28 MED ORDER — PHENYLEPHRINE 40 MCG/ML (10ML) SYRINGE FOR IV PUSH (FOR BLOOD PRESSURE SUPPORT)
80.0000 ug | PREFILLED_SYRINGE | INTRAVENOUS | Status: DC | PRN
  Filled 2020-02-28: qty 10

## 2020-02-28 MED ORDER — SOD CITRATE-CITRIC ACID 500-334 MG/5ML PO SOLN
30.0000 mL | ORAL | Status: DC | PRN
  Administered 2020-02-29: 30 mL via ORAL
  Filled 2020-02-28: qty 15

## 2020-02-28 MED ORDER — ONDANSETRON HCL 4 MG/2ML IJ SOLN
4.0000 mg | Freq: Four times a day (QID) | INTRAMUSCULAR | Status: DC | PRN
  Administered 2020-02-29: 4 mg via INTRAVENOUS
  Filled 2020-02-28: qty 2

## 2020-02-28 MED ORDER — LACTATED RINGERS IV SOLN: 500.0000 mL | INTRAVENOUS | Status: DC | PRN

## 2020-02-28 MED ORDER — FLEET ENEMA 7-19 GM/118ML RE ENEM: 1.0000 | ENEMA | RECTAL | Status: DC | PRN

## 2020-02-28 MED ORDER — OXYTOCIN BOLUS FROM INFUSION: 333.0000 mL | Freq: Once | INTRAVENOUS | Status: DC

## 2020-02-28 MED ORDER — SOD CITRATE-CITRIC ACID 500-334 MG/5ML PO SOLN: 30.0000 mL | ORAL | Status: DC | PRN

## 2020-02-28 MED ORDER — LIDOCAINE HCL (PF) 1 % IJ SOLN
INTRAMUSCULAR | Status: DC | PRN
  Administered 2020-02-28 (×2): 5 mL via EPIDURAL

## 2020-02-28 MED ORDER — FENTANYL-BUPIVACAINE-NACL 0.5-0.125-0.9 MG/250ML-% EP SOLN
12.0000 mL/h | EPIDURAL | Status: DC | PRN
  Filled 2020-02-28: qty 250

## 2020-02-28 MED ORDER — PENICILLIN G POT IN DEXTROSE 60000 UNIT/ML IV SOLN
3.0000 10*6.[IU] | INTRAVENOUS | Status: DC
  Administered 2020-02-28 – 2020-02-29 (×4): 3 10*6.[IU] via INTRAVENOUS
  Filled 2020-02-28 (×4): qty 50

## 2020-02-28 MED ORDER — DIPHENHYDRAMINE HCL 50 MG/ML IJ SOLN: 12.5000 mg | INTRAMUSCULAR | Status: DC | PRN

## 2020-02-28 MED ORDER — ONDANSETRON HCL 4 MG/2ML IJ SOLN: 4.0000 mg | Freq: Four times a day (QID) | INTRAMUSCULAR | Status: DC | PRN

## 2020-02-28 MED ORDER — OXYTOCIN-SODIUM CHLORIDE 30-0.9 UT/500ML-% IV SOLN
1.0000 m[IU]/min | INTRAVENOUS | Status: DC
  Administered 2020-02-28: 2 m[IU]/min via INTRAVENOUS
  Filled 2020-02-28: qty 500

## 2020-02-28 NOTE — Anesthesia Preprocedure Evaluation (Signed)
Anesthesia Evaluation  Patient identified by MRN, date of birth, ID band Patient awake    Reviewed: Allergy & Precautions, NPO status , Patient's Chart, lab work & pertinent test results  Airway Mallampati: II  TM Distance: >3 FB Neck ROM: Full    Dental no notable dental hx.    Pulmonary neg pulmonary ROS,    Pulmonary exam normal breath sounds clear to auscultation       Cardiovascular negative cardio ROS Normal cardiovascular exam Rhythm:Regular Rate:Normal     Neuro/Psych negative neurological ROS  negative psych ROS   GI/Hepatic negative GI ROS, Neg liver ROS,   Endo/Other  negative endocrine ROS  Renal/GU negative Renal ROS  negative genitourinary   Musculoskeletal negative musculoskeletal ROS (+)   Abdominal   Peds negative pediatric ROS (+)  Hematology negative hematology ROS (+) anemia ,   Anesthesia Other Findings   Reproductive/Obstetrics (+) Pregnancy                             Anesthesia Physical Anesthesia Plan  ASA: II  Anesthesia Plan: Epidural   Post-op Pain Management:    Induction:   PONV Risk Score and Plan:   Airway Management Planned:   Additional Equipment:   Intra-op Plan:   Post-operative Plan:   Informed Consent: I have reviewed the patients History and Physical, chart, labs and discussed the procedure including the risks, benefits and alternatives for the proposed anesthesia with the patient or authorized representative who has indicated his/her understanding and acceptance.       Plan Discussed with: Anesthesiologist  Anesthesia Plan Comments:         Anesthesia Quick Evaluation

## 2020-02-28 NOTE — Progress Notes (Signed)
Labor Progress Note  S/O: Patient comfortable with epidural, denies any feelings of pressure  Vitals:   02/28/20 1531 02/28/20 1601  BP: 108/62 116/65  Pulse: 91 89  Resp:    Temp:    SpO2:      SVE: 5/90/0  EFM: cat II baseline 145 bpm mod var +accels intermittent variable decels Toco: ctxs q 1-3 min  A/P: 29Y G1P0 @ 39.3 SROM/labor GBS+/RH+  -SROM: afebrile cont to monitor, Pitocin initially on for augmentation, however turned off after prolonged decel s/p epidural placement (hypotensive at that time) resolved with improvement of BP, ctx well on own pattern, cont expectant mgmt for now. Discussed restarting Pitocin if ctx pattern spaces -Epidural labor pain mgmt -cont PCN for +GBS -cont EFM/Toco -cat II tracing for intermittent variable decels, but spontaneously recovers, mod var, +accels, and excellent response to fetal scalp stim reassuring, cont to monitor and perform fetal resuscitative measures as needed -LGA, discussed possibility of c/s if not progressing well, pt ok and in agreement -Routine intrapartum care, currently anticipating SVD  Nanda Bittick A Carlyle Mcelrath 02/28/20 4:18 PM

## 2020-02-28 NOTE — Anesthesia Procedure Notes (Signed)
Epidural Patient location during procedure: OB Start time: 02/28/2020 1:15 PM End time: 02/28/2020 1:25 PM  Staffing Anesthesiologist: Mellody Dance, MD Performed: anesthesiologist   Preanesthetic Checklist Completed: patient identified, IV checked, site marked, risks and benefits discussed, monitors and equipment checked, pre-op evaluation and timeout performed  Epidural Patient position: sitting Prep: DuraPrep Patient monitoring: heart rate, cardiac monitor, continuous pulse ox and blood pressure Approach: midline Location: L2-L3 Injection technique: LOR saline  Needle:  Needle type: Tuohy  Needle gauge: 17 G Needle length: 9 cm Needle insertion depth: 5 cm Catheter type: closed end flexible Catheter size: 20 Guage Catheter at skin depth: 10 cm Test dose: negative and Other  Assessment Events: blood not aspirated, injection not painful, no injection resistance and negative IV test  Additional Notes Informed consent obtained prior to proceeding including risk of failure, 1% risk of PDPH, risk of minor discomfort and bruising.  Discussed rare but serious complications including epidural abscess, permanent nerve injury, epidural hematoma.  Discussed alternatives to epidural analgesia and patient desires to proceed.  Timeout performed pre-procedure verifying patient name, procedure, and platelet count.  Patient tolerated procedure well.

## 2020-02-28 NOTE — H&P (Signed)
Patricia Montgomery is a 29 y.o. G1P0000 at [redacted]w[redacted]d presenting for ruptured membranes. Pt notes mild contractions . Good fetal movement, No vaginal bleeding, started leaking fluid at 9:15 AM.  [facility] at [facility] since 5 wks -Dated by 5-week ultrasound -Borderline pelvis and baby measuring large for gestational age on ultrasound at 31, 5 and 38 weeks.  Patient is aware of possibility of a C-section -GBS positive   Prenatal Transfer Tool  Maternal Diabetes: No Genetic Screening: Declined Maternal Ultrasounds/Referrals: Normal Fetal Ultrasounds or other Referrals:  None Maternal Substance Abuse:  No Significant Maternal Medications:  None Significant Maternal Lab Results: Group B Strep positive     OB History    Gravida  1   Para  0   Term  0   Preterm  0   AB  0   Living  0     SAB  0   TAB  0   Ectopic  0   Multiple  0   Live Births             Past Medical History:  Diagnosis Date  . Chlamydia contact, treated    7/11, 10/11   No past surgical history on file. Family History: family history includes Diabetes in her maternal grandmother; Heart Problems in her maternal grandmother; Hypertension in her maternal grandmother. Social History:  reports that she has never smoked. She has never used smokeless tobacco. She reports that she does not drink alcohol and does not use drugs.  Review of Systems - Negative except Leaking fluid     Blood pressure 128/78, pulse 84, temperature 99 F (37.2 C), temperature source Oral, resp. rate 18, height 4\' 11"  (1.499 m), weight 77.4 kg, last menstrual period 05/14/2019.  Physical Exam:  Gen: well appearing, no distress Abd: gravid, NT, no RUQ pain LE: Trace edema, equal bilaterally, non-tender Cervical exam from the office 3 cm vertex high station Toco: Every 2 to 5 minutes, irregular FH: baseline 140s, accelerations present, no deceleratons, 10 beat variability  Prenatal labs: ABO, Rh: O/Positive/-- (04/20  0000) Antibody: Negative (04/20 0000) Rubella: Immune (04/20 0000) RPR: Nonreactive (04/20 0000)  HBsAg: Negative (04/20 0000)  HIV: Non-reactive (04/20 0000)  GBS: Positive/-- (11/02 0000)  1 hr Glucola 107  Genetic screening declined Anatomy 06-18-1973 normal   Assessment/Plan: 29 y.o. G1P0000 at [redacted]w[redacted]d Rupture of membranes, early labor.  Plan Pitocin Borderline pelvis patient aware possibility of needing cesarean section GBS positive start penicillin Reactive fetal testing   [redacted]w[redacted]d 02/28/2020, 12:12 PM

## 2020-02-29 ENCOUNTER — Encounter (HOSPITAL_COMMUNITY): Payer: Self-pay | Admitting: Obstetrics

## 2020-02-29 ENCOUNTER — Encounter (HOSPITAL_COMMUNITY)
Admission: AD | Disposition: A | Discharge status: Home / Self Care | Payer: Self-pay | Source: Home / Self Care | Attending: Obstetrics

## 2020-02-29 LAB — CBC WITH DIFFERENTIAL/PLATELET
Abs Immature Granulocytes: 0.06 10*3/uL (ref 0.00–0.07)
Basophils Absolute: 0 10*3/uL (ref 0.0–0.1)
Basophils Relative: 0 %
Eosinophils Absolute: 0 10*3/uL (ref 0.0–0.5)
Eosinophils Relative: 0 %
HCT: 27 % — ABNORMAL LOW (ref 36.0–46.0)
Hemoglobin: 8.8 g/dL — ABNORMAL LOW (ref 12.0–15.0)
Immature Granulocytes: 0 %
Lymphocytes Relative: 3 %
Lymphs Abs: 0.5 10*3/uL — ABNORMAL LOW (ref 0.7–4.0)
MCH: 29.3 pg (ref 26.0–34.0)
MCHC: 32.6 g/dL (ref 30.0–36.0)
MCV: 90 fL (ref 80.0–100.0)
Monocytes Absolute: 0.8 10*3/uL (ref 0.1–1.0)
Monocytes Relative: 5 %
Neutro Abs: 14.6 10*3/uL — ABNORMAL HIGH (ref 1.7–7.7)
Neutrophils Relative %: 92 %
Platelets: 187 10*3/uL (ref 150–400)
RBC: 3 MIL/uL — ABNORMAL LOW (ref 3.87–5.11)
RDW: 14 % (ref 11.5–15.5)
WBC: 16 10*3/uL — ABNORMAL HIGH (ref 4.0–10.5)
nRBC: 0 % (ref 0.0–0.2)

## 2020-02-29 LAB — RPR: RPR Ser Ql: NONREACTIVE

## 2020-02-29 SURGERY — Surgical Case
Anesthesia: Epidural

## 2020-02-29 MED ORDER — SODIUM CHLORIDE 0.9% FLUSH: 3.0000 mL | INTRAVENOUS | Status: DC | PRN

## 2020-02-29 MED ORDER — PHENYLEPHRINE 40 MCG/ML (10ML) SYRINGE FOR IV PUSH (FOR BLOOD PRESSURE SUPPORT)
PREFILLED_SYRINGE | INTRAVENOUS | Status: AC
  Filled 2020-02-29: qty 10

## 2020-02-29 MED ORDER — OXYTOCIN-SODIUM CHLORIDE 30-0.9 UT/500ML-% IV SOLN
2.5000 [IU]/h | INTRAVENOUS | Status: AC
  Administered 2020-02-29: 2.5 [IU]/h via INTRAVENOUS
  Filled 2020-02-29: qty 500

## 2020-02-29 MED ORDER — METHYLERGONOVINE MALEATE 0.2 MG/ML IJ SOLN
INTRAMUSCULAR | Status: AC
  Filled 2020-02-29: qty 1

## 2020-02-29 MED ORDER — MENTHOL 3 MG MT LOZG: 1.0000 | LOZENGE | OROMUCOSAL | Status: DC | PRN

## 2020-02-29 MED ORDER — DIBUCAINE (PERIANAL) 1 % EX OINT: 1.0000 "application " | TOPICAL_OINTMENT | CUTANEOUS | Status: DC | PRN

## 2020-02-29 MED ORDER — SODIUM CHLORIDE 0.9 % IV SOLN
500.0000 mg | Freq: Once | INTRAVENOUS | Status: AC
  Administered 2020-02-29: 500 mg via INTRAVENOUS

## 2020-02-29 MED ORDER — SENNOSIDES-DOCUSATE SODIUM 8.6-50 MG PO TABS
2.0000 | ORAL_TABLET | ORAL | Status: DC
  Administered 2020-02-29 – 2020-03-01 (×2): 2 via ORAL
  Filled 2020-02-29 (×2): qty 2

## 2020-02-29 MED ORDER — IBUPROFEN 800 MG PO TABS: 800.0000 mg | ORAL_TABLET | Freq: Four times a day (QID) | ORAL | Status: DC

## 2020-02-29 MED ORDER — ONDANSETRON HCL 4 MG/2ML IJ SOLN: 4.0000 mg | Freq: Once | INTRAMUSCULAR | Status: DC | PRN

## 2020-02-29 MED ORDER — FENTANYL CITRATE (PF) 100 MCG/2ML IJ SOLN
INTRAMUSCULAR | Status: AC
  Filled 2020-02-29: qty 2

## 2020-02-29 MED ORDER — DIPHENHYDRAMINE HCL 50 MG/ML IJ SOLN: 12.5000 mg | INTRAMUSCULAR | Status: DC | PRN

## 2020-02-29 MED ORDER — MORPHINE SULFATE (PF) 0.5 MG/ML IJ SOLN
INTRAMUSCULAR | Status: DC | PRN
  Administered 2020-02-29: 3 mg via EPIDURAL

## 2020-02-29 MED ORDER — DEXAMETHASONE SODIUM PHOSPHATE 4 MG/ML IJ SOLN
INTRAMUSCULAR | Status: AC
  Filled 2020-02-29: qty 1

## 2020-02-29 MED ORDER — OXYTOCIN-SODIUM CHLORIDE 30-0.9 UT/500ML-% IV SOLN
INTRAVENOUS | Status: AC
  Filled 2020-02-29: qty 500

## 2020-02-29 MED ORDER — METOCLOPRAMIDE HCL 5 MG/ML IJ SOLN
INTRAMUSCULAR | Status: DC | PRN
  Administered 2020-02-29: 10 mg via INTRAVENOUS

## 2020-02-29 MED ORDER — COCONUT OIL OIL
1.0000 "application " | TOPICAL_OIL | Status: DC | PRN
  Administered 2020-03-02: 1 via TOPICAL

## 2020-02-29 MED ORDER — SODIUM CHLORIDE 0.9 % IV SOLN
INTRAVENOUS | Status: AC
  Filled 2020-02-29: qty 500

## 2020-02-29 MED ORDER — LACTATED RINGERS IV SOLN: INTRAVENOUS | Status: DC | PRN

## 2020-02-29 MED ORDER — NALBUPHINE HCL 10 MG/ML IJ SOLN: 5.0000 mg | Freq: Once | INTRAMUSCULAR | Status: DC | PRN

## 2020-02-29 MED ORDER — NALOXONE HCL 4 MG/10ML IJ SOLN
1.0000 ug/kg/h | INTRAVENOUS | Status: DC | PRN
  Filled 2020-02-29: qty 5

## 2020-02-29 MED ORDER — KETOROLAC TROMETHAMINE 30 MG/ML IJ SOLN
INTRAMUSCULAR | Status: AC
  Filled 2020-02-29: qty 1

## 2020-02-29 MED ORDER — OXYTOCIN-SODIUM CHLORIDE 30-0.9 UT/500ML-% IV SOLN
INTRAVENOUS | Status: DC | PRN
  Administered 2020-02-29: 30 [IU] via INTRAVENOUS

## 2020-02-29 MED ORDER — DEXMEDETOMIDINE (PRECEDEX) IN NS 20 MCG/5ML (4 MCG/ML) IV SYRINGE
PREFILLED_SYRINGE | INTRAVENOUS | Status: AC
  Filled 2020-02-29: qty 10

## 2020-02-29 MED ORDER — ONDANSETRON HCL 4 MG/2ML IJ SOLN: 4.0000 mg | Freq: Three times a day (TID) | INTRAMUSCULAR | Status: DC | PRN

## 2020-02-29 MED ORDER — LIDOCAINE-EPINEPHRINE (PF) 2 %-1:200000 IJ SOLN
INTRAMUSCULAR | Status: DC | PRN
  Administered 2020-02-29: 5 mL via EPIDURAL
  Administered 2020-02-29 (×2): 10 mL via EPIDURAL
  Administered 2020-02-29: 2 mL via EPIDURAL
  Administered 2020-02-29: 3 mL via EPIDURAL

## 2020-02-29 MED ORDER — LACTATED RINGERS IV BOLUS
300.0000 mL | Freq: Once | INTRAVENOUS | Status: AC
  Administered 2020-02-29: 300 mL via INTRAVENOUS

## 2020-02-29 MED ORDER — WITCH HAZEL-GLYCERIN EX PADS: 1.0000 "application " | MEDICATED_PAD | CUTANEOUS | Status: DC | PRN

## 2020-02-29 MED ORDER — KETOROLAC TROMETHAMINE 30 MG/ML IJ SOLN
30.0000 mg | Freq: Four times a day (QID) | INTRAMUSCULAR | Status: AC
  Administered 2020-02-29 – 2020-03-01 (×3): 30 mg via INTRAVENOUS
  Filled 2020-02-29 (×3): qty 1

## 2020-02-29 MED ORDER — MORPHINE SULFATE (PF) 0.5 MG/ML IJ SOLN
INTRAMUSCULAR | Status: DC | PRN
Start: 2020-02-29 — End: 2020-02-29
  Administered 2020-02-29: .5 mg via EPIDURAL
  Administered 2020-02-29: 1 mg via EPIDURAL
  Administered 2020-02-29: .5 mg via EPIDURAL

## 2020-02-29 MED ORDER — METHYLERGONOVINE MALEATE 0.2 MG/ML IJ SOLN
INTRAMUSCULAR | Status: DC | PRN
  Administered 2020-02-29: .2 mg via INTRAMUSCULAR

## 2020-02-29 MED ORDER — ONDANSETRON HCL 4 MG/2ML IJ SOLN
INTRAMUSCULAR | Status: DC | PRN
  Administered 2020-02-29: 4 mg via INTRAVENOUS

## 2020-02-29 MED ORDER — SCOPOLAMINE 1 MG/3DAYS TD PT72
MEDICATED_PATCH | TRANSDERMAL | Status: DC | PRN
  Administered 2020-02-29: 1 via TRANSDERMAL

## 2020-02-29 MED ORDER — NALOXONE HCL 0.4 MG/ML IJ SOLN: 0.4000 mg | INTRAMUSCULAR | Status: DC | PRN

## 2020-02-29 MED ORDER — DIPHENHYDRAMINE HCL 50 MG/ML IJ SOLN
INTRAMUSCULAR | Status: AC
  Filled 2020-02-29: qty 1

## 2020-02-29 MED ORDER — SIMETHICONE 80 MG PO CHEW
80.0000 mg | CHEWABLE_TABLET | Freq: Three times a day (TID) | ORAL | Status: DC
  Administered 2020-02-29 – 2020-03-02 (×4): 80 mg via ORAL
  Filled 2020-02-29 (×4): qty 1

## 2020-02-29 MED ORDER — SIMETHICONE 80 MG PO CHEW
80.0000 mg | CHEWABLE_TABLET | ORAL | Status: DC
  Administered 2020-02-29 – 2020-03-01 (×2): 80 mg via ORAL
  Filled 2020-02-29 (×2): qty 1

## 2020-02-29 MED ORDER — FENTANYL CITRATE (PF) 100 MCG/2ML IJ SOLN
INTRAMUSCULAR | Status: DC | PRN
  Administered 2020-02-29: 100 ug via EPIDURAL

## 2020-02-29 MED ORDER — TRANEXAMIC ACID-NACL 1000-0.7 MG/100ML-% IV SOLN
INTRAVENOUS | Status: DC | PRN
  Administered 2020-02-29: 1000 mg via INTRAVENOUS

## 2020-02-29 MED ORDER — MEPERIDINE HCL 25 MG/ML IJ SOLN
INTRAMUSCULAR | Status: DC | PRN
  Administered 2020-02-29 (×2): 12.5 mg via INTRAVENOUS

## 2020-02-29 MED ORDER — HYDROMORPHONE HCL 1 MG/ML IJ SOLN: 0.2500 mg | INTRAMUSCULAR | Status: DC | PRN

## 2020-02-29 MED ORDER — SODIUM CHLORIDE 0.9 % IV SOLN: INTRAVENOUS | Status: DC | PRN

## 2020-02-29 MED ORDER — SCOPOLAMINE 1 MG/3DAYS TD PT72: 1.0000 | MEDICATED_PATCH | Freq: Once | TRANSDERMAL | Status: DC

## 2020-02-29 MED ORDER — ONDANSETRON HCL 4 MG/2ML IJ SOLN
INTRAMUSCULAR | Status: AC
  Filled 2020-02-29: qty 2

## 2020-02-29 MED ORDER — ZOLPIDEM TARTRATE 5 MG PO TABS: 5.0000 mg | ORAL_TABLET | Freq: Every evening | ORAL | Status: DC | PRN

## 2020-02-29 MED ORDER — MORPHINE SULFATE (PF) 0.5 MG/ML IJ SOLN
INTRAMUSCULAR | Status: AC
  Filled 2020-02-29: qty 10

## 2020-02-29 MED ORDER — TRANEXAMIC ACID-NACL 1000-0.7 MG/100ML-% IV SOLN
INTRAVENOUS | Status: AC
  Filled 2020-02-29: qty 100

## 2020-02-29 MED ORDER — FENTANYL CITRATE (PF) 100 MCG/2ML IJ SOLN
INTRAMUSCULAR | Status: DC | PRN
Start: 2020-02-29 — End: 2020-02-29
  Administered 2020-02-29: 100 ug via INTRAVENOUS

## 2020-02-29 MED ORDER — DIPHENHYDRAMINE HCL 25 MG PO CAPS: 25.0000 mg | ORAL_CAPSULE | ORAL | Status: DC | PRN

## 2020-02-29 MED ORDER — DEXMEDETOMIDINE (PRECEDEX) IN NS 20 MCG/5ML (4 MCG/ML) IV SYRINGE
PREFILLED_SYRINGE | INTRAVENOUS | Status: DC | PRN
  Administered 2020-02-29 (×2): 4 ug via INTRAVENOUS
  Administered 2020-02-29 (×3): 8 ug via INTRAVENOUS
  Administered 2020-02-29: 4 ug via INTRAVENOUS

## 2020-02-29 MED ORDER — SCOPOLAMINE 1 MG/3DAYS TD PT72
MEDICATED_PATCH | TRANSDERMAL | Status: AC
  Filled 2020-02-29: qty 1

## 2020-02-29 MED ORDER — ACETAMINOPHEN 500 MG PO TABS
1000.0000 mg | ORAL_TABLET | Freq: Four times a day (QID) | ORAL | Status: DC
  Administered 2020-02-29 – 2020-03-02 (×8): 1000 mg via ORAL
  Filled 2020-02-29 (×8): qty 2

## 2020-02-29 MED ORDER — KETOROLAC TROMETHAMINE 30 MG/ML IJ SOLN
INTRAMUSCULAR | Status: DC | PRN
  Administered 2020-02-29: 30 mg via INTRAVENOUS

## 2020-02-29 MED ORDER — DEXAMETHASONE SODIUM PHOSPHATE 4 MG/ML IJ SOLN
INTRAMUSCULAR | Status: DC | PRN
  Administered 2020-02-29: 4 mg via INTRAVENOUS

## 2020-02-29 MED ORDER — OXYTOCIN 10 UNIT/ML IJ SOLN
INTRAMUSCULAR | Status: DC | PRN
  Administered 2020-02-29: 10 [IU] via INTRAMUSCULAR

## 2020-02-29 MED ORDER — CEFAZOLIN SODIUM-DEXTROSE 2-4 GM/100ML-% IV SOLN
2.0000 g | Freq: Once | INTRAVENOUS | Status: AC
  Administered 2020-02-29: 2 g via INTRAVENOUS

## 2020-02-29 MED ORDER — CEFAZOLIN SODIUM-DEXTROSE 2-4 GM/100ML-% IV SOLN
INTRAVENOUS | Status: AC
  Filled 2020-02-29: qty 100

## 2020-02-29 MED ORDER — KETOROLAC TROMETHAMINE 30 MG/ML IJ SOLN: 30.0000 mg | Freq: Once | INTRAMUSCULAR | Status: DC | PRN

## 2020-02-29 MED ORDER — MEPERIDINE HCL 25 MG/ML IJ SOLN: 6.2500 mg | INTRAMUSCULAR | Status: DC | PRN

## 2020-02-29 MED ORDER — NALBUPHINE HCL 10 MG/ML IJ SOLN: 5.0000 mg | INTRAMUSCULAR | Status: DC | PRN

## 2020-02-29 MED ORDER — DIPHENHYDRAMINE HCL 25 MG PO CAPS: 25.0000 mg | ORAL_CAPSULE | Freq: Four times a day (QID) | ORAL | Status: DC | PRN

## 2020-02-29 MED ORDER — HYDROMORPHONE HCL 2 MG PO TABS: 2.0000 mg | ORAL_TABLET | ORAL | Status: DC | PRN

## 2020-02-29 MED ORDER — DIPHENHYDRAMINE HCL 50 MG/ML IJ SOLN
INTRAMUSCULAR | Status: DC | PRN
  Administered 2020-02-29: 25 mg via INTRAVENOUS

## 2020-02-29 MED ORDER — PRENATAL MULTIVITAMIN CH
1.0000 | ORAL_TABLET | Freq: Every day | ORAL | Status: DC
  Administered 2020-03-01 – 2020-03-02 (×2): 1 via ORAL
  Filled 2020-02-29 (×2): qty 1

## 2020-02-29 MED ORDER — PHENYLEPHRINE HCL (PRESSORS) 10 MG/ML IV SOLN
INTRAVENOUS | Status: DC | PRN
  Administered 2020-02-29 (×3): 80 ug via INTRAVENOUS

## 2020-02-29 MED ORDER — LACTATED RINGERS IV SOLN: INTRAVENOUS | Status: DC

## 2020-02-29 MED ORDER — METOCLOPRAMIDE HCL 5 MG/ML IJ SOLN
INTRAMUSCULAR | Status: AC
  Filled 2020-02-29: qty 2

## 2020-02-29 MED ORDER — MEPERIDINE HCL 25 MG/ML IJ SOLN
INTRAMUSCULAR | Status: AC
  Filled 2020-02-29: qty 1

## 2020-02-29 MED ORDER — SIMETHICONE 80 MG PO CHEW: 80.0000 mg | CHEWABLE_TABLET | ORAL | Status: DC | PRN

## 2020-02-29 SURGICAL SUPPLY — 38 items
BENZOIN TINCTURE PRP APPL 2/3 (GAUZE/BANDAGES/DRESSINGS) ×2 IMPLANT
CHLORAPREP W/TINT 26ML (MISCELLANEOUS) ×2 IMPLANT
CLAMP CORD UMBIL (MISCELLANEOUS) IMPLANT
CLOTH BEACON ORANGE TIMEOUT ST (SAFETY) ×2 IMPLANT
DRSG OPSITE POSTOP 4X10 (GAUZE/BANDAGES/DRESSINGS) ×2 IMPLANT
DRSG PAD ABDOMINAL 8X10 ST (GAUZE/BANDAGES/DRESSINGS) ×2 IMPLANT
ELECT REM PT RETURN 9FT ADLT (ELECTROSURGICAL) ×2
ELECTRODE REM PT RTRN 9FT ADLT (ELECTROSURGICAL) ×1 IMPLANT
EXTRACTOR VACUUM KIWI (MISCELLANEOUS) IMPLANT
GAUZE SPONGE 4X4 12PLY STRL LF (GAUZE/BANDAGES/DRESSINGS) ×4 IMPLANT
GLOVE BIOGEL PI IND STRL 7.0 (GLOVE) ×3 IMPLANT
GLOVE BIOGEL PI INDICATOR 7.0 (GLOVE) ×3
GLOVE ECLIPSE 6.5 STRL STRAW (GLOVE) ×2 IMPLANT
GOWN STRL REUS W/TWL LRG LVL3 (GOWN DISPOSABLE) ×4 IMPLANT
HEMOSTAT ARISTA ABSORB 3G PWDR (HEMOSTASIS) ×2 IMPLANT
KIT ABG SYR 3ML LUER SLIP (SYRINGE) IMPLANT
LIGASURE IMPACT 36 18CM CVD LR (INSTRUMENTS) ×2 IMPLANT
NEEDLE HYPO 25X5/8 SAFETYGLIDE (NEEDLE) IMPLANT
NS IRRIG 1000ML POUR BTL (IV SOLUTION) ×2 IMPLANT
PACK C SECTION WH (CUSTOM PROCEDURE TRAY) ×2 IMPLANT
PAD ABD 7.5X8 STRL (GAUZE/BANDAGES/DRESSINGS) ×2 IMPLANT
PAD OB MATERNITY 4.3X12.25 (PERSONAL CARE ITEMS) ×2 IMPLANT
RETAINER VISCERAL (MISCELLANEOUS) ×2 IMPLANT
RTRCTR C-SECT PINK 25CM LRG (MISCELLANEOUS) IMPLANT
STRIP CLOSURE SKIN 1/2X4 (GAUZE/BANDAGES/DRESSINGS) IMPLANT
STRIP SURGICAL 1/4 X 6 IN (GAUZE/BANDAGES/DRESSINGS) ×2 IMPLANT
SUT MNCRL 0 VIOLET CTX 36 (SUTURE) ×7 IMPLANT
SUT MON AB-0 CT1 36 (SUTURE) ×2 IMPLANT
SUT MONOCRYL 0 CTX 36 (SUTURE) ×7
SUT PLAIN 0 NONE (SUTURE) IMPLANT
SUT PLAIN 2 0 (SUTURE) ×1
SUT PLAIN ABS 2-0 CT1 27XMFL (SUTURE) ×1 IMPLANT
SUT VIC AB 0 CT1 27 (SUTURE) ×3
SUT VIC AB 0 CT1 27XBRD ANBCTR (SUTURE) ×3 IMPLANT
SUT VIC AB 4-0 KS 27 (SUTURE) ×2 IMPLANT
TOWEL OR 17X24 6PK STRL BLUE (TOWEL DISPOSABLE) ×2 IMPLANT
TRAY FOLEY W/BAG SLVR 14FR LF (SET/KITS/TRAYS/PACK) IMPLANT
WATER STERILE IRR 1000ML POUR (IV SOLUTION) ×2 IMPLANT

## 2020-02-29 NOTE — Op Note (Signed)
Procedure(s): CESAREAN SECTION Procedure Note  Patricia Montgomery female 29 y.o. 02/29/2020  Procedure(s) and Anesthesia Type:  *Primary low transverse cesarean section  *Regional anesthesia with epidural  Surgeon(s) and Role:    Conni Elliot, Zakyria Metzinger DO - Primary  Indications: Patricia Montgomery 29 y.o. G1P1001 [redacted]w[redacted]d     Surgeon: Toy Baker   Assistant: Arlan Organ, CNM  Anesthesia: Epidural anesthesia   Procedure Detail  CESAREAN SECTION  Findings: Normal mullerian anatomy including bilateral tubes and ovaries.  Viable live female infant with weight 3135g (6pounds 14ounces) Apgars 8 and 9.   Estimated Blood Loss:  1,039 QBL         Specimens: Placenta spontaneous intact         Complications:  Right uterine extension         Disposition: PACU - hemodynamically stable.         Condition: stable    Description of Procedure: The patient was taken to the operating room where epidural anesthesia was found to be adequate.  The patient was placed in the dorsal supine position.  Fetal heart tones were confirmed.  The patient was subsequently prepped and draped in the normal sterile fashion.    Patricia low transverse skin incision was made with Patricia scalpel and carried down to the level of the fascia.  The fascia was incised in the midline with the scalpel and extended laterally with curved Mayo scissors.  Kocher clamps were applied to the superior fascial edge and the fascia was dissected off the rectus muscle sharply using the scalpel.  The Kocher clamps were transferred to the inferior fascial edge and the underlying rectus muscle was dissected off with curved Mayo's scissors.  The rectus muscles then were separated in the midline.  The peritoneum was found free of adherent bowel and the peritoneal cavity was entered bluntly. The peritoneum incision was extended laterally by stretch. Patricia bladder flap was then created sharply with Metzenbaum scissors and separated from the lower uterine  segment digitally.   Patricia low transverse hysterotomy was then made with Patricia scalpel.  The infant was found in the cephalic presentation was delivered atraumatically and without difficulty in the usual fashion.  After 60 seconds of delayed cord clamping the cord was clamped and cut and the infant was handed off to the pediatricians.  The placenta was delivered with gentle traction on umbilical cord and manual massage of the uterine fundus.  The uterus was cleared of all clot and debris.  The hysterotomy was then closed with 0 Monocryl in Patricia running locked fashion. Patricia deep uterine extension was noted on the right lateral aspect of the hysterotomy. This was tracked downward to the apex and tented upwards using ring forceps. The extension was closed using running 0-Monocryl suture. Despite extension closure, continued bleeding was appreciated from the angle. TXA was administered by anesthesia to aid in hemostasis. Patricia second imbricating layer was run using 0-Monocryl. Continued bleeding noted from the right angle. At this point assistance was called in and the faculty OB provider scrubbed in. Hemostatic compression sutures using 0-Vicryl were placed along the right angle. Additionally, 10U of IM Pitocin was injected directly into the myometrium. Improvement of hemostasis was noted. The uterus was returned to the pelvic cavity. The bilateral gutters were cleared of all clot and debris. Topical hemostatic Arista was placed along the hysterotomy line. The hysterotomy was found to be hemostatic.  The peritoneum was closed with Vicryl in Patricia running fashion.  The fascia was closed with  Patricia 0 Vicryl suture in Patricia continuous running fashion. The subcutaneous tissue was irrigated and rendered hemostatic with cautery.  The subcutaneous layer was subsequently closed with 2-0 Plain placing three interrupted stitches.  The skin was closed with 4-0 Vicryl in Patricia running subcuticular fashion.  Sponge, lap and needle counts were correct. Pressure  dressing was applied to the skin. Patient tolerated the procedure well and was taken to the recovery room in stable condition.  Patricia Montgomery Patricia Montgomery 02/29/20 11:04 AM

## 2020-02-29 NOTE — Transfer of Care (Signed)
Immediate Anesthesia Transfer of Care Note  Patient: Patricia Montgomery  Procedure(s) Performed: CESAREAN SECTION (N/A )  Patient Location: PACU  Anesthesia Type:Epidural  Level of Consciousness: awake, alert  and oriented  Airway & Oxygen Therapy: Patient Spontanous Breathing and Patient connected to nasal cannula oxygen  Post-op Assessment: Report given to RN and Post -op Vital signs reviewed and stable  Post vital signs: Reviewed and stable  Last Vitals:  Vitals Value Taken Time  BP 113/73 02/29/20 0954  Temp 36.8 C 02/29/20 0954  Pulse 82 02/29/20 0956  Resp 17 02/29/20 0956  SpO2 96 % 02/29/20 0956  Vitals shown include unvalidated device data.  Last Pain:  Vitals:   02/29/20 0954  TempSrc: Oral  PainSc: 0-No pain         Complications: No complications documented.

## 2020-02-29 NOTE — Anesthesia Postprocedure Evaluation (Signed)
Anesthesia Post Note  Patient: Patricia Montgomery  Procedure(s) Performed: CESAREAN SECTION (N/A )     Patient location during evaluation: Mother Baby Anesthesia Type: Epidural Level of consciousness: awake and alert Pain management: pain level controlled Vital Signs Assessment: post-procedure vital signs reviewed and stable Respiratory status: spontaneous breathing, nonlabored ventilation and respiratory function stable Cardiovascular status: stable Postop Assessment: no headache, no backache and epidural receding Anesthetic complications: no   No complications documented.  Last Vitals:  Vitals:   02/29/20 1111 02/29/20 1227  BP: 118/70 118/61  Pulse: 96 97  Resp:  17  Temp:  36.7 C  SpO2: 98% 97%    Last Pain:  Vitals:   02/29/20 1227  TempSrc: Oral  PainSc: 0-No pain   Pain Goal:                   Rion Catala

## 2020-02-29 NOTE — Progress Notes (Signed)
Labor Progress Note  29Y G1P0 @ 39.4 labor. Patient has been pushing with good maternal effort for just under 2 hours with no descent. Fetal station remains at 0. With suspected LGA, discussed with patient concern for CPD. Recommend proceeding with cesarean section at this time for arrest of descent. Patient in agreement with MOD. Counseled patient on risks, including but not limited to bleeding, infection, damage to surrounding organs, and implications for future pregnancies. Plan for Ancef 2g and Azithromycin 500mg  IV ppx. OR team notified.  Talecia Sherlin A Coran Dipaola 02/29/20 7:03 AM

## 2020-02-29 NOTE — Progress Notes (Signed)
Labor Progress Note  S/O: Patient started pushing 5:10AM, starting to feel more pressure with contractions  Vitals:   02/29/20 0402 02/29/20 0430  BP: 123/70 121/73  Pulse: 79 83  Resp: 16 16  Temp:    SpO2:     SVE: 10/100/0  EFM: cat II baseline 150 bpm mod var with variable decelerations with ctxs Toco: ctxs q 2-3 min  A/P: 29Y G1P0 @ 39.4 in second stage of labor GBS+/RH+  -Labor: progressed well to fully dilated on pts own ctx pattern, but fetal station remains high after laboring down for 1hr and pushing for just under 1 hour. Discussed with patient concern for fetal size and CPD. Will reassess pushing status in 30-60 min, if no fetal descent will recommend cesarean section at that time. Patient aware and in agreement with plan  -Cont EFM/Toco -Cat II tracing with variable decels- occurs with pushing and spontaneously resolves with moderate variability in between reassuring -Epidural labor pain mgmt -PCN +GBS -Routine intrapartum care -Guarded regarding MOD  Patricia Montgomery A Cherilyn Sautter 02/29/20 6:01 AM

## 2020-02-29 NOTE — Lactation Note (Signed)
This note was copied from a baby's chart. Lactation Consultation Note  Patient Name: Boy Janaysha Depaulo SWHQP'R Date: 02/29/2020 Reason for consult: Initial assessment  Initial visit was at 6 hours of life. Mom reports + breast changes w/pregnancy.   Mom had recently pumped10 mL. Mom had tried using size 24 & size 27 flanges, but felt they were not the right size. At rest, her nipples seem to need size 21 flanges, which I provided. I also gave and discussed the flange handout from [employer] that shows how a nipple should look when being pumped. Mom agreed that the other flanges must have been too large.  Hand expression was taught to Mom by the lactation student; the resulting EBM was added to the colostrum she had pumped recently. Mom was made aware of O/P services, breastfeeding support groups, and our phone # for post-discharge questions.   Mom agrees there is no need for a daily lactation visit; she'll call out for lactation prn.     Lurline Hare [facility] 02/29/2020, 1:52 PM

## 2020-03-01 DIAGNOSIS — D62 Acute posthemorrhagic anemia: Secondary | ICD-10-CM | POA: Diagnosis not present

## 2020-03-01 LAB — CBC
HCT: 18.4 % — ABNORMAL LOW (ref 36.0–46.0)
Hemoglobin: 5.9 g/dL — CL (ref 12.0–15.0)
MCH: 29.8 pg (ref 26.0–34.0)
MCHC: 32.1 g/dL (ref 30.0–36.0)
MCV: 92.9 fL (ref 80.0–100.0)
Platelets: 151 10*3/uL (ref 150–400)
RBC: 1.98 MIL/uL — ABNORMAL LOW (ref 3.87–5.11)
RDW: 14.3 % (ref 11.5–15.5)
WBC: 10.2 10*3/uL (ref 4.0–10.5)
nRBC: 0 % (ref 0.0–0.2)

## 2020-03-01 LAB — BIRTH TISSUE RECOVERY COLLECTION (PLACENTA DONATION)

## 2020-03-01 LAB — PREPARE RBC (CROSSMATCH)

## 2020-03-01 LAB — ABO/RH: ABO/RH(D): O POS

## 2020-03-01 MED ORDER — IBUPROFEN 800 MG PO TABS
800.0000 mg | ORAL_TABLET | Freq: Four times a day (QID) | ORAL | Status: DC
  Administered 2020-03-01 – 2020-03-02 (×5): 800 mg via ORAL
  Filled 2020-03-01 (×5): qty 1

## 2020-03-01 MED ORDER — OXYCODONE HCL 5 MG PO TABS
10.0000 mg | ORAL_TABLET | Freq: Four times a day (QID) | ORAL | Status: DC | PRN
  Administered 2020-03-01 – 2020-03-02 (×2): 10 mg via ORAL
  Filled 2020-03-01 (×2): qty 2

## 2020-03-01 MED ORDER — SODIUM CHLORIDE 0.9% IV SOLUTION: Freq: Once | INTRAVENOUS | Status: DC

## 2020-03-01 MED ORDER — HYDROMORPHONE HCL 1 MG/ML IJ SOLN
2.0000 mg | Freq: Once | INTRAMUSCULAR | Status: AC
  Administered 2020-03-01: 2 mg via INTRAVENOUS
  Filled 2020-03-01: qty 2

## 2020-03-01 MED ORDER — OXYCODONE HCL 5 MG PO TABS: 10.0000 mg | ORAL_TABLET | ORAL | Status: DC | PRN

## 2020-03-01 MED ORDER — OXYCODONE HCL 5 MG PO TABS
5.0000 mg | ORAL_TABLET | ORAL | Status: DC | PRN
  Administered 2020-03-01 – 2020-03-02 (×3): 5 mg via ORAL
  Filled 2020-03-01 (×3): qty 1

## 2020-03-01 NOTE — Progress Notes (Signed)
RN notified CNM about pt's critical Hgb of 5.8. New orders placed for type/screen for 2 units. CNM stated she will come speak with pt before transfusion. RN will continue to monitor.  Herbert Moors, RN

## 2020-03-01 NOTE — Progress Notes (Signed)
Subjective: POD# 1 Live born female  Birth Weight: 6 lb 14.6 oz (3135 g) APGAR: 8, 9  Newborn Delivery   Birth date/time: 02/29/2020 07:42:00 Delivery type: C-Section, Low Transverse Trial of labor: No C-section categorization: Primary / arrest of descent     Baby name: Wilmon Pali Delivering provider: LAW, CASSANDRA A   Feeding: breast and bottle  Pain control at delivery: Epidural   Reports feeling better after Dilaudid IV for pain control. Pain started worsening during the night and not relieved with Tylenol and Toradol.  Has been up to BR with assist for pericare, no dizziness or C/P, no SOB.  Patient reports tolerating PO.   Breast symptoms:pumping colostrum Denies HA/SOB/C/P/N/V/dizziness. Flatus present. She reports vaginal bleeding as normal, without clots Objective:   VS:    Vitals:   02/29/20 1820 02/29/20 2000 02/29/20 2330 03/01/20 0300  BP:  106/61 (!) 102/52 102/66  Pulse:  99 (!) 105 (!) 107  Resp:  18 18 18   Temp:  98.5 F (36.9 C) 98.3 F (36.8 C) 98.6 F (37 C)  TempSrc:  Oral Axillary Oral  SpO2: 97% 100% 100% 99%  Weight:      Height:          Intake/Output Summary (Last 24 hours) at 03/01/2020 0709 Last data filed at 03/01/2020 0530 Gross per 24 hour  Intake 2900 ml  Output 3964 ml  Net -1064 ml        Recent Labs    02/29/20 1208 03/01/20 0440  WBC 16.0* 10.2  HGB 8.8* 5.9*  HCT 27.0* 18.4*  PLT 187 151     Blood type: --/--/O POS Performed at [facility], [address] [place], [place]  [phone])  Rubella: Immune (04/20 0000)  Vaccines: TDaP          UTD         Flu             UTD                    COVID-19 UTD   Physical Exam:  General: alert, cooperative and no distress CV: Regular rate and rhythm Resp: clear Abdomen: soft, nontender, normal bowel sounds, mild tympany Incision: clean, dry, intact and pressure dressing removed Uterine Fundus: firm, below umbilicus, nontender Lochia:  minimal Ext: edema +1 pedal and pretibial      Assessment/Plan: 29 y.o.   POD# 1. G1P1001                  Principal Problem:   Postpartum care following cesarean delivery 11/24 Active Problems:   Labor and delivery, indication for care   PROM (premature rupture of membranes)   Cesarean delivery - Arrest of decent  - increased blood loss intraoperative with R angle extension, ? Oozing for hemoperitoneum   Acute blood loss anemia  - mild tachy otherwise compensating well.   - recommend blood transfusion and patient agrees.  Risk & benefits of transfusion with acute blood loss and hypovolemia reviewed with patient.  No treatment --> prolonged recovery, interference with milk production, fatigue, shortness of breath, risk for syncope and injury.  Transfusion --> treatment will replace blood cells and volume, risk for exposure to infectious blood-borne disease even with screening, allergic reaction, fever.  Will transfuse 2 units PRBC with pre-medication Tylenol and Benadryl and Lasix between bag 2 & 3.    Doing well, stable.  Advance diet as tolerated Encourage rest when baby rests Breastfeeding support Encourage to ambulate Routine post-op care  Dr. Amado Nash updated w/ POC  Neta Mends, CNM, MSN 03/01/2020, 7:09 AM

## 2020-03-01 NOTE — Progress Notes (Signed)
Pt seen, VS and exam stable, Sitting up eating regular breakfast, CBC discussed and recommend blood transfusion 2 units even though not symptomatic. D/w pt risks/ complications and reportable s/s of anemia and transfusion reaction. Risks from transfusion as in communicable diseases incl HIV/ Hepatitis discussed and she accepts.

## 2020-03-01 NOTE — Lactation Note (Signed)
This note was copied from a baby's chart. Lactation Consultation Note  Patient Name: Boy Kiley Solimine JJKKX'F Date: 03/01/2020    [facility] Follow Up Visit:  Per previous LC note, mother does not desire a daily consult, however, she will call as needed for assistance.   Maternal Data    Feeding Feeding Type: Formula Nipple Type: Slow - flow  LATCH Score                   Interventions    Lactation Tools Discussed/Used     Consult Status      Dimitrious Micciche R Dontavia Brand 03/01/2020, 11:23 AM

## 2020-03-02 ENCOUNTER — Other Ambulatory Visit (HOSPITAL_COMMUNITY): Payer: Managed Care, Other (non HMO)

## 2020-03-02 LAB — BPAM RBC
Blood Product Expiration Date: 202112252359
Blood Product Expiration Date: 202112252359
ISSUE DATE / TIME: 202111250951
ISSUE DATE / TIME: 202111251353
Unit Type and Rh: 5100
Unit Type and Rh: 5100

## 2020-03-02 LAB — TYPE AND SCREEN
ABO/RH(D): O POS
Antibody Screen: NEGATIVE
Unit division: 0
Unit division: 0

## 2020-03-02 LAB — CBC
HCT: 26.2 % — ABNORMAL LOW (ref 36.0–46.0)
Hemoglobin: 8.4 g/dL — ABNORMAL LOW (ref 12.0–15.0)
MCH: 28.9 pg (ref 26.0–34.0)
MCHC: 32.1 g/dL (ref 30.0–36.0)
MCV: 90 fL (ref 80.0–100.0)
Platelets: 187 10*3/uL (ref 150–400)
RBC: 2.91 MIL/uL — ABNORMAL LOW (ref 3.87–5.11)
RDW: 16 % — ABNORMAL HIGH (ref 11.5–15.5)
WBC: 8.5 10*3/uL (ref 4.0–10.5)
nRBC: 0 % (ref 0.0–0.2)

## 2020-03-02 MED ORDER — POLYSACCHARIDE IRON COMPLEX 150 MG PO CAPS: 150.0000 mg | ORAL_CAPSULE | Freq: Every day | ORAL | 1 refills | Status: DC

## 2020-03-02 MED ORDER — OXYCODONE HCL 5 MG PO TABS: 5.0000 mg | ORAL_TABLET | ORAL | 0 refills | Status: DC | PRN

## 2020-03-02 MED ORDER — COCONUT OIL OIL: 1.0000 "application " | TOPICAL_OIL | 0 refills | Status: DC | PRN

## 2020-03-02 MED ORDER — ACETAMINOPHEN 500 MG PO TABS
1000.0000 mg | ORAL_TABLET | Freq: Four times a day (QID) | ORAL | 0 refills | Status: DC
Start: 2020-03-02 — End: 2021-02-14

## 2020-03-02 MED ORDER — IBUPROFEN 800 MG PO TABS
800.0000 mg | ORAL_TABLET | Freq: Four times a day (QID) | ORAL | 0 refills | Status: DC
Start: 2020-03-02 — End: 2021-02-14

## 2020-03-02 NOTE — Discharge Instructions (Signed)

## 2020-03-02 NOTE — Discharge Summary (Signed)
OB Discharge Summary  Patient Name: Patricia Montgomery DOB: [birth year] MRN: [id number]  Date of admission: 02/28/2020 Delivering provider: LAW, CASSANDRA A   Admitting diagnosis: Labor and delivery, indication for care [O75.9] PROM (premature rupture of membranes) [O42.90] Intrauterine pregnancy: [redacted]w[redacted]d     Secondary diagnosis: Patient Active Problem List   Diagnosis Date Noted  . Acute blood loss anemia 03/01/2020  . Postpartum care following cesarean delivery 11/24 02/29/2020  . Cesarean delivery - Arrest of descent 02/29/2020  . Labor and delivery, indication for care 02/28/2020  . PROM (premature rupture of membranes) 02/28/2020    Date of discharge: 03/02/2020   Discharge diagnosis: Principal Problem:   Postpartum care following cesarean delivery 11/24 Active Problems:   Labor and delivery, indication for care   PROM (premature rupture of membranes)   Cesarean delivery - Arrest of descent   Acute blood loss anemia                                                       Post partum procedures:blood transfusion  Augmentation: N/A Pain control: Epidural  Laceration:  Episiotomy:None  Complications: Hemorrhage>1068mL  Hospital course:  Onset of Labor With Unplanned C/S   29 y.o. yo G1P1001 at [redacted]w[redacted]d was admitted in Latent Labor on 02/28/2020. Patient had a labor course significant for pushing for 2 hours with no descent. The patient went for cesarean section due to Arrest of Descent. Delivery details as follows: Membrane Rupture Time/Date: 9:45 AM ,02/28/2020   Delivery Method:C-Section, Low Transverse  Details of operation can be found in separate operative note. Patient had a postpartum course complicated by a PPH of 1064cc. Her hemoglobin dropped from 8.8 to 5.9 and she was transfused 2 units of PRBCs. Her hemoglobin on PO#2 was 8.4.  She is ambulating, tolerating a regular diet, passing flatus, and urinating well.  Patient is discharged home in stable condition  03/02/20.  Newborn Data: Birth date:02/29/2020  Birth time:7:42 AM  Gender:Female  Living status:Living  Apgars:8 ,9  Weight:3135 g   Physical exam  Vitals:   03/01/20 1401 03/01/20 1625 03/01/20 2329 03/02/20 1034  BP: 106/63 108/60 115/75 124/68  Pulse: 87  97 86  Resp: 18 16 18 18   Temp: 98.2 F (36.8 C) 98.8 F (37.1 C) 98.7 F (37.1 C) 98.9 F (37.2 C)  TempSrc: Oral Oral Oral Oral  SpO2:  100% 99% 100%  Weight:      Height:       General: alert, cooperative and no distress Lochia: appropriate Uterine Fundus: firm Incision: Healing well with no significant drainage, Dressing is clean, dry, and intact DVT Evaluation: No evidence of DVT seen on physical exam. No significant calf/ankle edema. Labs: Lab Results  Component Value Date   WBC 8.5 03/02/2020   HGB 8.4 (L) 03/02/2020   HCT 26.2 (L) 03/02/2020   MCV 90.0 03/02/2020   PLT 187 03/02/2020   CMP Latest Ref Rng & Units 10/17/2019  Glucose 70 - 99 mg/dL 12/18/2019)  BUN 6 - 20 mg/dL 6  Creatinine 509(T - 2.67 mg/dL 1.24  Sodium 5.80 - 998 mmol/L 138  Potassium 3.5 - 5.1 mmol/L 3.9  Chloride 98 - 111 mmol/L 106  CO2 22 - 32 mmol/L 22  Calcium 8.9 - 10.3 mg/dL 9.4  Total Protein 6.5 - 8.1 g/dL -  Total Bilirubin  0.3 - 1.2 mg/dL -  Alkaline Phos 38 - 831 U/L -  AST 15 - 41 U/L -  ALT 14 - 54 U/L -   Edinburgh Postnatal Depression Scale Screening Tool 02/29/2020  I have been able to laugh and see the funny side of things. 0  I have looked forward with enjoyment to things. 0  I have blamed myself unnecessarily when things went wrong. 1  I have been anxious or worried for no good reason. 1  I have felt scared or panicky for no good reason. 0  Things have been getting on top of me. 0  I have been so unhappy that I have had difficulty sleeping. 0  I have felt sad or miserable. 0  I have been so unhappy that I have been crying. 0  The thought of harming myself has occurred to me. 0  Edinburgh Postnatal Depression  Scale Total 2    Vaccines: TDaP UTD         Flu    UTD         COVID-19   UTD  Discharge instructions:  per After Visit Summary and [facility] booklet  After Visit Meds:  Allergies as of 03/02/2020   No Known Allergies     Medication List    STOP taking these medications   DHEA PO   OVER THE COUNTER MEDICATION     TAKE these medications   acetaminophen 500 MG tablet Commonly known as: TYLENOL Take 2 tablets (1,000 mg total) by mouth every 6 (six) hours.   coconut oil Oil Apply 1 application topically as needed.   ibuprofen 800 MG tablet Commonly known as: ADVIL Take 1 tablet (800 mg total) by mouth every 6 (six) hours.   iron polysaccharides 150 MG capsule Commonly known as: Ferrex 150 Take 1 capsule (150 mg total) by mouth daily.   oxyCODONE 5 MG immediate release tablet Commonly known as: Oxy IR/ROXICODONE Take 1 tablet (5 mg total) by mouth every 4 (four) hours as needed for moderate pain.   PRENATAL VITAMIN PO Take by mouth.       Diet: routine diet  Activity: Advance as tolerated. Pelvic rest for 6 weeks.   Newborn Data: Live born female  Birth Weight: 6 lb 14.6 oz (3135 g) APGAR: 8, 9  Newborn Delivery   Birth date/time: 02/29/2020 07:42:00 Delivery type: C-Section, Low Transverse Trial of labor: No C-section categorization: Primary       Named Wilmon Pali Baby Feeding: Breast Disposition:home with mother  Delivery Report:   Review the Delivery Report for details.    Follow up:  Follow-up Information    Shea Evans, MD. Schedule an appointment as soon as possible for a visit in 6 week(s).   Specialty: Obstetrics and Gynecology Why: Please make an appointment at 6 weeks postpartum.  Contact information: [address] [place] [address] [phone]               Clancy Gourd, MSN 03/02/2020, 11:55 AM

## 2020-03-03 ENCOUNTER — Telehealth (HOSPITAL_COMMUNITY): Payer: Self-pay

## 2020-03-03 NOTE — Telephone Encounter (Signed)
Telephone call from mom needing to rent a breastpump.  Gift store closed.  Mom has private insurance and has a DEBP from [place] that is not working. Mom has an older breastpump as well.  Mom reports suction on neither is  good.  Attempt to trouble shoot with mom but she reports she has tried everything. [facility] not open today as well.  Inquired about a hand pump.  Mom reports she has a handpump.  Offered to give her another handpump but she reported that would just be too hard To pump on both breasts with handpump.  Tried to call [facility] and there is no one avaible there to issue a breastpump either.  Mom reports she will go back and forth from her two electric pump and manual pump until Monday and then rent one or get in touch with [facility].  Urged mom to call lactation back as needed.

## 2020-03-04 ENCOUNTER — Inpatient Hospital Stay (HOSPITAL_COMMUNITY)
Admission: AD | Admit: 2020-03-04 | Payer: Managed Care, Other (non HMO) | Source: Home / Self Care | Admitting: Obstetrics & Gynecology

## 2020-03-04 ENCOUNTER — Inpatient Hospital Stay (HOSPITAL_COMMUNITY): Payer: Managed Care, Other (non HMO)

## 2020-04-21 ENCOUNTER — Other Ambulatory Visit: Payer: Managed Care, Other (non HMO)

## 2020-04-21 DIAGNOSIS — Z20822 Contact with and (suspected) exposure to covid-19: Secondary | ICD-10-CM

## 2020-04-24 LAB — NOVEL CORONAVIRUS, NAA: SARS-CoV-2, NAA: NOT DETECTED

## 2020-06-11 ENCOUNTER — Ambulatory Visit: Payer: Managed Care, Other (non HMO) | Admitting: Family Medicine

## 2020-06-14 ENCOUNTER — Ambulatory Visit: Payer: Managed Care, Other (non HMO) | Admitting: Family Medicine

## 2020-06-14 DIAGNOSIS — Z0289 Encounter for other administrative examinations: Secondary | ICD-10-CM

## 2021-02-14 ENCOUNTER — Other Ambulatory Visit: Payer: Self-pay

## 2021-02-14 ENCOUNTER — Ambulatory Visit (INDEPENDENT_AMBULATORY_CARE_PROVIDER_SITE_OTHER): Payer: Managed Care, Other (non HMO) | Admitting: Nurse Practitioner

## 2021-02-14 ENCOUNTER — Encounter (HOSPITAL_BASED_OUTPATIENT_CLINIC_OR_DEPARTMENT_OTHER): Payer: Self-pay | Admitting: Nurse Practitioner

## 2021-02-14 VITALS — BP 122/84 | HR 61 | Ht 59.0 in | Wt 146.0 lb

## 2021-02-14 DIAGNOSIS — Z130 Encounter for screening for diseases of the blood and blood-forming organs and certain disorders involving the immune mechanism: Secondary | ICD-10-CM | POA: Diagnosis not present

## 2021-02-14 DIAGNOSIS — Z1329 Encounter for screening for other suspected endocrine disorder: Secondary | ICD-10-CM

## 2021-02-14 DIAGNOSIS — Z1321 Encounter for screening for nutritional disorder: Secondary | ICD-10-CM | POA: Diagnosis not present

## 2021-02-14 DIAGNOSIS — R002 Palpitations: Secondary | ICD-10-CM | POA: Diagnosis not present

## 2021-02-14 DIAGNOSIS — Z13228 Encounter for screening for other metabolic disorders: Secondary | ICD-10-CM

## 2021-02-14 DIAGNOSIS — Z0000 Encounter for general adult medical examination without abnormal findings: Secondary | ICD-10-CM

## 2021-02-14 NOTE — Progress Notes (Signed)
Orma Render, DNP, AGNP-c Primary Care & Sports Medicine [address]  [place], [place] [address] [phone]  New patient visit   Patient: Patricia Montgomery   DOB: [birth year]   30 y.o. Female  MRN: [id number] Visit Date: 02/14/2021  Patient Care Team: Travia Onstad, Coralee Pesa, NP as PCP - General (Nurse Practitioner)  Today's healthcare provider: Orma Render, NP   Chief Complaint  Patient presents with   Establish Care    Patient here to establish care, seen no one in 10 years. Over the past month she has episodes that she feels that her heart slows down lasting about 1 min and then goes away x 1 month. She does follow up w/her OB/GYN regular.    Subjective    Patricia Montgomery is a 30 y.o. female who presents today as a new patient to establish care.  HPI HPI     Establish Care    Additional comments: Patient here to establish care, seen no one in 10 years. Over the past month she has episodes that she feels that her heart slows down lasting about 1 min and then goes away x 1 month. She does follow up w/her OB/GYN regular.       Last edited by Pennelope Bracken on 02/14/2021 10:41 AM.      Patricia Montgomery has not seen a PCP in about 10 years.  She has recently been seen by OB/GYN and followed regularly.  She is approximately 1 year postpartum at this time. She has no chronic medical conditions. She does have concerns today for palpitations that are intermittent and have been ongoing for the past few weeks/1 month. She endorses a sensation of feeling her heartbeat within her chest and describes this as "feels like it slows down and beats very hard".   She has not had evaluation for this in the past.  She has no family or personal history of cardiac abnormalities or complications.  She has not experienced anything like this in the past. She is not on any medications and does not consume excessive amounts of caffeine.  She does endorse that during the  delivery of her child she had a significant amount of blood loss and hemoglobin dropped to 4.  She did receive 2 blood transfusions but no follow-up was performed for reevaluation of her hemoglobin levels.  She has no nausea, vomiting, dizziness, chest pain, shortness of breath. She does endorse some increased anxiety due to being a new mother and having these responsibilities as well as being in school for her masters degree in social work. She does not feel that the symptoms of palpitations are associated with exacerbations of anxiety or panic symptoms. She reports the symptoms will occur occasionally while she is seated and relaxed with no significant events going on.  She also endorses difficulty losing weight since her pregnancy.  She reports that she can lose a few pounds but then gains the right back very quickly. Her last menstrual period was 02/10/2021.  She is not on any birth control currently and is not planning pregnancy at this time. She has been unable to tolerate hormonal contraceptives due to side effects.  Past Medical History:  Diagnosis Date   Chlamydia contact, treated    7/11, 10/11   Past Surgical History:  Procedure Laterality Date   CESAREAN SECTION N/A 02/29/2020   Procedure: CESAREAN SECTION;  Surgeon: Armandina Stammer, DO;  Location: [facility];  Service: Obstetrics;  Laterality: N/A;  Family Status  Relation Name Status   MGM  Alive   Mother  Alive   Father  Alive   Sister  Alive   MGF  Alive   Family History  Problem Relation Age of Onset   Diabetes Maternal Grandmother    Heart Problems Maternal Grandmother    Hypertension Maternal Grandmother    Social History   Socioeconomic History   Marital status: Married    Spouse name: Not on file   Number of children: Not on file   Years of education: Not on file   Highest education level: Not on file  Occupational History   Not on file  Tobacco Use   Smoking status: Never   Smokeless tobacco: Never   Vaping Use   Vaping Use: Never used  Substance and Sexual Activity   Alcohol use: No    Alcohol/week: 0.0 standard drinks   Drug use: No   Sexual activity: Yes    Partners: Male    Birth control/protection: None  Other Topics Concern   Not on file  Social History Narrative   Not on file   Social Determinants of Health   Financial Resource Strain: Not on file  Food Insecurity: Not on file  Transportation Needs: Not on file  Physical Activity: Not on file  Stress: Not on file  Social Connections: Not on file   Outpatient Medications Prior to Visit  Medication Sig   [DISCONTINUED] acetaminophen (TYLENOL) 500 MG tablet Take 2 tablets (1,000 mg total) by mouth every 6 (six) hours.   [DISCONTINUED] coconut oil OIL Apply 1 application topically as needed.   [DISCONTINUED] ibuprofen (ADVIL) 800 MG tablet Take 1 tablet (800 mg total) by mouth every 6 (six) hours.   [DISCONTINUED] iron polysaccharides (FERREX 150) 150 MG capsule Take 1 capsule (150 mg total) by mouth daily.   [DISCONTINUED] oxyCODONE (OXY IR/ROXICODONE) 5 MG immediate release tablet Take 1 tablet (5 mg total) by mouth every 4 (four) hours as needed for moderate pain.   [DISCONTINUED] Prenatal Vit-Fe Fumarate-FA (PRENATAL VITAMIN PO) Take by mouth.   No facility-administered medications prior to visit.   Not on File  Immunization History  Administered Date(s) Administered   Hepatitis B 04/08/2003   Influenza-Unspecified 01/11/2017, 01/26/2018   PPD Test 09/10/2011, 11/04/2013   Tdap 06/05/2009    Health Maintenance  Topic Date Due   COVID-19 Vaccine (1) Never done   Hepatitis C Screening  Never done   TETANUS/TDAP  06/06/2019   PAP SMEAR-Modifier  03/24/2020   INFLUENZA VACCINE  11/05/2020   HIV Screening  Completed   Pneumococcal Vaccine 24-30 Years old  Aged Out   HPV VACCINES  Aged Out    Patient Care Team: Natilee Gauer, Coralee Pesa, NP as PCP - General (Nurse Practitioner)  Review of Systems All review of  systems negative except what is listed in the HPI    Objective    BP 122/84   Pulse 61   Ht '4\' 11"'  (1.499 m)   Wt 146 lb (66.2 kg)   LMP 02/10/2021   SpO2 92%   BMI 29.49 kg/m  Physical Exam Vitals and nursing note reviewed.  Constitutional:      General: She is not in acute distress.    Appearance: Normal appearance.  Eyes:     Extraocular Movements: Extraocular movements intact.     Conjunctiva/sclera: Conjunctivae normal.     Pupils: Pupils are equal, round, and reactive to light.  Neck:     Vascular: No  carotid bruit.  Cardiovascular:     Rate and Rhythm: Normal rate and regular rhythm.     Pulses: Normal pulses.     Heart sounds: Normal heart sounds. No murmur heard. Pulmonary:     Effort: Pulmonary effort is normal.     Breath sounds: Normal breath sounds. No wheezing.  Abdominal:     General: Bowel sounds are normal.     Palpations: Abdomen is soft.  Musculoskeletal:        General: Normal range of motion.     Cervical back: Normal range of motion.     Right lower leg: No edema.     Left lower leg: No edema.  Skin:    General: Skin is warm and dry.     Capillary Refill: Capillary refill takes less than 2 seconds.  Neurological:     General: No focal deficit present.     Mental Status: She is alert and oriented to person, place, and time.  Psychiatric:        Mood and Affect: Mood normal.        Behavior: Behavior normal.        Thought Content: Thought content normal.        Judgment: Judgment normal.     Depression Screen PHQ 2/9 Scores 02/15/2021  PHQ - 2 Score 0  PHQ- 9 Score 1   Results for orders placed or performed in visit on 02/14/21  CBC with Differential/Platelet  Result Value Ref Range   WBC 5.7 3.4 - 10.8 x10E3/uL   RBC 4.30 3.77 - 5.28 x10E6/uL   Hemoglobin 12.7 11.1 - 15.9 g/dL   Hematocrit 38.0 34.0 - 46.6 %   MCV 88 79 - 97 fL   MCH 29.5 26.6 - 33.0 pg   MCHC 33.4 31.5 - 35.7 g/dL   RDW 12.2 11.7 - 15.4 %   Platelets 295 150  - 450 x10E3/uL   Neutrophils 56 Not Estab. %   Lymphs 36 Not Estab. %   Monocytes 6 Not Estab. %   Eos 1 Not Estab. %   Basos 1 Not Estab. %   Neutrophils Absolute 3.2 1.4 - 7.0 x10E3/uL   Lymphocytes Absolute 2.0 0.7 - 3.1 x10E3/uL   Monocytes Absolute 0.4 0.1 - 0.9 x10E3/uL   EOS (ABSOLUTE) 0.1 0.0 - 0.4 x10E3/uL   Basophils Absolute 0.0 0.0 - 0.2 x10E3/uL   Immature Granulocytes 0 Not Estab. %   Immature Grans (Abs) 0.0 0.0 - 0.1 x10E3/uL  Comprehensive metabolic panel  Result Value Ref Range   Glucose 85 70 - 99 mg/dL   BUN 9 6 - 20 mg/dL   Creatinine, Ser 0.82 0.57 - 1.00 mg/dL   eGFR 99 >59 mL/min/1.73   BUN/Creatinine Ratio 11 9 - 23   Sodium 137 134 - 144 mmol/L   Potassium 4.4 3.5 - 5.2 mmol/L   Chloride 100 96 - 106 mmol/L   CO2 23 20 - 29 mmol/L   Calcium 9.6 8.7 - 10.2 mg/dL   Total Protein 7.5 6.0 - 8.5 g/dL   Albumin 4.7 3.9 - 5.0 g/dL   Globulin, Total 2.8 1.5 - 4.5 g/dL   Albumin/Globulin Ratio 1.7 1.2 - 2.2   Bilirubin Total 0.3 0.0 - 1.2 mg/dL   Alkaline Phosphatase 68 44 - 121 IU/L   AST 14 0 - 40 IU/L   ALT 14 0 - 32 IU/L  Thyroid Panel With TSH  Result Value Ref Range   TSH 1.430 0.450 - 4.500  uIU/mL   T4, Total 7.1 4.5 - 12.0 ug/dL   T3 Uptake Ratio 26 24 - 39 %   Free Thyroxine Index 1.8 1.2 - 4.9  VITAMIN D 25 Hydroxy (Vit-D Deficiency, Fractures)  Result Value Ref Range   Vit D, 25-Hydroxy 17.4 (L) 30.0 - 100.0 ng/mL    Assessment & Plan      Problem List Items Addressed This Visit     Heart palpitations - Primary    New onset of intermittent palpitations noted with no associated symptoms and no cardiac history. She did have significant blood loss 1 year ago with delivery of her child and has not had repeat hemoglobin evaluation therefore we will obtain this today.  We will also obtain additional labs including thyroid for further evaluation. Symptoms do not appear to be associated with anxiety. There are no alarm symptoms present today  heart rate is regular and within normal limits. Given her age and new onset of symptoms we will send referral to cardiology for further evaluation and possible cardiac monitoring. In office EKG deferred today given no symptoms present at this time. We will plan to follow-up based on labs and cardiology evaluation. She will be due for CPE in the near future. She is aware of alarm symptoms that would require immediate evaluation in the emergency room or follow-up with me.      Relevant Orders   CBC with Differential/Platelet (Completed)   Comprehensive metabolic panel (Completed)   Thyroid Panel With TSH (Completed)   VITAMIN D 25 Hydroxy (Vit-D Deficiency, Fractures) (Completed)   Ambulatory referral to Cardiology   Encounter for medical examination to establish care    Review of current and past medical history, social history, medication, and family history.  Review of care gaps and health maintenance recommendations.  Records from recent providers to be requested if not available in Chart Review or Care Everywhere.  Recommendations for health maintenance, diet, and exercise provided.  Labs today: CBC, CMP, A1c, TSH HM Recommendations: Flu, COVID CPE due: In next few weeks       Other Visit Diagnoses     Screening for deficiency anemia       Relevant Orders   CBC with Differential/Platelet (Completed)   Screening for thyroid disorder       Relevant Orders   Thyroid Panel With TSH (Completed)   Screening for endocrine, nutritional, metabolic and immunity disorder       Relevant Orders   CBC with Differential/Platelet (Completed)   Comprehensive metabolic panel (Completed)   Thyroid Panel With TSH (Completed)   VITAMIN D 25 Hydroxy (Vit-D Deficiency, Fractures) (Completed)        Return for CPE in near future.      Tempie Gibeault, Coralee Pesa, NP, DNP, AGNP-C Primary Care & Sports Medicine at [facility]   Time: 45 minutes, >50% spent  counseling, care coordination, chart review, and documentation.

## 2021-02-14 NOTE — Patient Instructions (Signed)
Thank you for choosing [place] at [facility] for your Primary Care needs. I am excited for the opportunity to partner with you to meet your health care goals. It was a pleasure meeting you today!  Recommendations from today's visit: I have sent the referral to cardiology for evaluation and the heart monitor.  Today we will get some labs to see if we can find anything that could be causing your symptoms.  I will contact you once the results are back to see if there is anything that we need to address.  We will plan on your physical in about 4 weeks or so- when it is convenient for you.   Information on diet, exercise, and health maintenance recommendations are listed below. This is information to help you be sure you are on track for optimal health and monitoring.   Please look over this and let us know if you have any questions or if you have completed any of the health maintenance outside of [facility] so that we can be sure your records are up to date.  ___________________________________________________________ About Me: I am an Adult-Geriatric Nurse Practitioner with a background in caring for patients for more than 20 years with a strong intensive care background. I provide primary care and sports medicine services to patients age 68 and older within this office. My education had a strong focus on caring for the older adult population, which I am passionate about. I am also the director of the APP Fellowship with [facility].   My desire is to provide you with the best service through preventive medicine and supportive care. I consider you a part of the medical team and value your input. I work diligently to ensure that you are heard and your needs are met in a safe and effective manner. I want you to feel comfortable with me as your provider and want you to know that your health concerns are important to me.  For your information, our office hours are: Monday,  Tuesday, and Thursday 8:00 AM - 5:00 PM Wednesday and Friday 8:00 AM - 12:00 PM.   In my time away from the office I am teaching new APP's within the system and am unavailable, but my partner, Dr. Burnard Bunting is in the office for emergent needs.   If you have questions or concerns, please call our office at [phone] or send [place] a MyChart message and we will respond as quickly as possible.  ____________________________________________________________ MyChart:  For all urgent or time sensitive needs we ask that you please call the office to avoid delays. Our number is [phone]. MyChart is not constantly monitored and due to the large volume of messages a day, replies may take up to 72 business hours.  MyChart Policy: MyChart allows for you to see your visit notes, after visit summary, provider recommendations, lab and tests results, make an appointment, request refills, and contact your provider or the office for non-urgent questions or concerns. Providers are seeing patients during normal business hours and do not have built in time to review MyChart messages.  We ask that you allow a minimum of 3 business days for responses to [employer]. For this reason, please do not send urgent requests through [place]. Please call the office at [phone]. New and ongoing conditions may require a visit. We have virtual and in person visit available for your convenience.  Complex MyChart concerns may require a visit. Your provider may request you schedule a virtual or  in person visit to ensure we are providing the best care possible. MyChart messages sent after 11:00 AM on Friday will not be received by the provider until Monday morning.    Lab and Test Results: You will receive your lab and test results on MyChart as soon as they are completed and results have been sent by the lab or testing facility. Due to this service, you will receive your results BEFORE your provider.  I review lab and tests  results each morning prior to seeing patients. Some results require collaboration with other providers to ensure you are receiving the most appropriate care. For this reason, we ask that you please allow a minimum of 3-5 business days from the time the ALL results have been received for your provider to receive and review lab and test results and contact you about these.  Most lab and test result comments from the provider will be sent through [place]. Your provider may recommend changes to the plan of care, follow-up visits, repeat testing, ask questions, or request an office visit to discuss these results. You may reply directly to this message or call the office at [phone] to provide information for the provider or set up an appointment. In some instances, you will be called with test results and recommendations. Please let us know if this is preferred and we will make note of this in your chart to provide this for you.    If you have not heard a response to your lab or test results in 5 business days from all results returning to [place], please call the office to let us know. We ask that you please avoid calling prior to this time unless there is an emergent concern. Due to high call volumes, this can delay the resulting process.  After Hours: For all non-emergency after hours needs, please call the office at [phone] and select the option to reach the on-call provider service. On-call services are shared between multiple [facility] offices and therefore it will not be possible to speak directly with your provider. On-call providers may provide medical advice and recommendations, but are unable to provide refills for maintenance medications.  For all emergency or urgent medical needs after normal business hours, we recommend that you seek care at the closest Urgent Care or Emergency Department to ensure appropriate treatment in a timely manner.  [facility] has a 24 hour  emergency room located on the ground floor for your convenience.   Urgent Concerns During the Business Day Providers are seeing patients from 8AM to [place] with a busy schedule and are most often not able to respond to non-urgent calls until the end of the day or the next business day. If you should have URGENT concerns during the day, please call and speak to the nurse or schedule a same day appointment so that we can address your concern without delay.   Thank you, again, for choosing me as your health care partner. I appreciate your trust and look forward to learning more about you.   Worthy Keeler, DNP, AGNP-c ___________________________________________________________  Health Maintenance Recommendations Screening Testing Mammogram Every 1 -2 years based on history and risk factors Starting at age 44 Pap Smear Ages 21-39 every 3 years Ages 17-65 every 5 years with HPV testing More frequent testing may be required based on results and history Colon Cancer Screening Every 1-10 years based on test performed, risk factors, and history Starting at age 53 Bone Density Screening Every 2-10  years based on history Starting at age 15 for women Recommendations for men differ based on medication usage, history, and risk factors AAA Screening One time ultrasound Men 63-69 years old who have every smoked Lung Cancer Screening Low Dose Lung CT every 12 months Age 17-80 years with a 30 pack-year smoking history who still smoke or who have quit within the last 15 years  Screening Labs Routine  Labs: Complete Blood Count (CBC), Complete Metabolic Panel (CMP), Cholesterol (Lipid Panel) Every 6-12 months based on history and medications May be recommended more frequently based on current conditions or previous results Hemoglobin A1c Lab Every 3-12 months based on history and previous results Starting at age 56 or earlier with diagnosis of diabetes, high cholesterol, BMI >26, and/or risk  factors Frequent monitoring for patients with diabetes to ensure blood sugar control Thyroid Panel (TSH w/ T3 & T4) Every 6 months based on history, symptoms, and risk factors May be repeated more often if on medication HIV One time testing for all patients 84 and older May be repeated more frequently for patients with increased risk factors or exposure Hepatitis C One time testing for all patients 52 and older May be repeated more frequently for patients with increased risk factors or exposure Gonorrhea, Chlamydia Every 12 months for all sexually active persons 13-24 years Additional monitoring may be recommended for those who are considered high risk or who have symptoms PSA Men 69-34 years old with risk factors Additional screening may be recommended from age 59-69 based on risk factors, symptoms, and history  Vaccine Recommendations Tetanus Booster All adults every 10 years Flu Vaccine All patients 6 months and older every year COVID Vaccine All patients 12 years and older Initial dosing with booster May recommend additional booster based on age and health history HPV Vaccine 2 doses all patients age 22-26 Dosing may be considered for patients over 26 Shingles Vaccine (Shingrix) 2 doses all adults 44 years and older Pneumonia (Pneumovax 23) All adults 67 years and older May recommend earlier dosing based on health history Pneumonia (Prevnar 44) All adults 75 years and older Dosed 1 year after Pneumovax 23  Additional Screening, Testing, and Vaccinations may be recommended on an individualized basis based on family history, health history, risk factors, and/or exposure.  __________________________________________________________  Diet Recommendations for All Patients  I recommend that all patients maintain a diet low in saturated fats, carbohydrates, and cholesterol. While this can be challenging at first, it is not impossible and small changes can make big differences.   Things to try: Decreasing the amount of soda, sweet tea, and/or juice to one or less per day and replace with water While water is always the first choice, if you do not like water you may consider adding a water additive without sugar to improve the taste other sugar free drinks Replace potatoes with a brightly colored vegetable at dinner Use healthy oils, such as canola oil or olive oil, instead of butter or hard margarine Limit your bread intake to two pieces or less a day Replace regular pasta with low carb pasta options Bake, broil, or grill foods instead of frying Monitor portion sizes  Eat smaller, more frequent meals throughout the day instead of large meals  An important thing to remember is, if you love foods that are not great for your health, you don't have to give them up completely. Instead, allow these foods to be a reward when you have done well. Allowing yourself to still have special  treats every once in a while is a nice way to tell yourself thank you for working hard to keep yourself healthy.   Also remember that every day is a new day. If you have a bad day and "fall off the wagon", you can still climb right back up and keep moving along on your journey!  We have resources available to help you!  Some websites that may be helpful include: www.http://carter.biz/  Www.VeryWellFit.com _____________________________________________________________  Activity Recommendations for All Patients  I recommend that all adults get at least 20 minutes of moderate physical activity that elevates your heart rate at least 5 days out of the week.  Some examples include: Walking or jogging at a pace that allows you to carry on a conversation Cycling (stationary bike or outdoors) Water aerobics Yoga Weight lifting Dancing If physical limitations prevent you from putting stress on your joints, exercise in a pool or seated in a chair are excellent options.  Do determine your MAXIMUM heart  rate for activity: YOUR AGE - 220 = MAX HeartRate   Remember! Do not push yourself too hard.  Start slowly and build up your pace, speed, weight, time in exercise, etc.  Allow your body to rest between exercise and get good sleep. You will need more water than normal when you are exerting yourself. Do not wait until you are thirsty to drink. Drink with a purpose of getting in at least 8, 8 ounce glasses of water a day plus more depending on how much you exercise and sweat.    If you begin to develop dizziness, chest pain, abdominal pain, jaw pain, shortness of breath, headache, vision changes, lightheadedness, or other concerning symptoms, stop the activity and allow your body to rest. If your symptoms are severe, seek emergency evaluation immediately. If your symptoms are concerning, but not severe, please let us know so that we can recommend further evaluation.

## 2021-02-15 DIAGNOSIS — Z0000 Encounter for general adult medical examination without abnormal findings: Secondary | ICD-10-CM | POA: Insufficient documentation

## 2021-02-15 DIAGNOSIS — R002 Palpitations: Secondary | ICD-10-CM | POA: Insufficient documentation

## 2021-02-15 LAB — VITAMIN D 25 HYDROXY (VIT D DEFICIENCY, FRACTURES): Vit D, 25-Hydroxy: 17.4 ng/mL — ABNORMAL LOW (ref 30.0–100.0)

## 2021-02-15 LAB — CBC WITH DIFFERENTIAL/PLATELET
Basophils Absolute: 0 10*3/uL (ref 0.0–0.2)
Basos: 1 %
EOS (ABSOLUTE): 0.1 10*3/uL (ref 0.0–0.4)
Eos: 1 %
Hematocrit: 38 % (ref 34.0–46.6)
Hemoglobin: 12.7 g/dL (ref 11.1–15.9)
Immature Grans (Abs): 0 10*3/uL (ref 0.0–0.1)
Immature Granulocytes: 0 %
Lymphocytes Absolute: 2 10*3/uL (ref 0.7–3.1)
Lymphs: 36 %
MCH: 29.5 pg (ref 26.6–33.0)
MCHC: 33.4 g/dL (ref 31.5–35.7)
MCV: 88 fL (ref 79–97)
Monocytes Absolute: 0.4 10*3/uL (ref 0.1–0.9)
Monocytes: 6 %
Neutrophils Absolute: 3.2 10*3/uL (ref 1.4–7.0)
Neutrophils: 56 %
Platelets: 295 10*3/uL (ref 150–450)
RBC: 4.3 x10E6/uL (ref 3.77–5.28)
RDW: 12.2 % (ref 11.7–15.4)
WBC: 5.7 10*3/uL (ref 3.4–10.8)

## 2021-02-15 LAB — COMPREHENSIVE METABOLIC PANEL
ALT: 14 IU/L (ref 0–32)
AST: 14 IU/L (ref 0–40)
Albumin/Globulin Ratio: 1.7 (ref 1.2–2.2)
Albumin: 4.7 g/dL (ref 3.9–5.0)
Alkaline Phosphatase: 68 IU/L (ref 44–121)
BUN/Creatinine Ratio: 11 (ref 9–23)
BUN: 9 mg/dL (ref 6–20)
Bilirubin Total: 0.3 mg/dL (ref 0.0–1.2)
CO2: 23 mmol/L (ref 20–29)
Calcium: 9.6 mg/dL (ref 8.7–10.2)
Chloride: 100 mmol/L (ref 96–106)
Creatinine, Ser: 0.82 mg/dL (ref 0.57–1.00)
Globulin, Total: 2.8 g/dL (ref 1.5–4.5)
Glucose: 85 mg/dL (ref 70–99)
Potassium: 4.4 mmol/L (ref 3.5–5.2)
Sodium: 137 mmol/L (ref 134–144)
Total Protein: 7.5 g/dL (ref 6.0–8.5)
eGFR: 99 mL/min/{1.73_m2} (ref >59)

## 2021-02-15 LAB — THYROID PANEL WITH TSH
Free Thyroxine Index: 1.8 (ref 1.2–4.9)
T3 Uptake Ratio: 26 % (ref 24–39)
T4, Total: 7.1 ug/dL (ref 4.5–12.0)
TSH: 1.43 u[IU]/mL (ref 0.450–4.500)

## 2021-02-15 NOTE — Assessment & Plan Note (Signed)
Review of current and past medical history, social history, medication, and family history.  Review of care gaps and health maintenance recommendations.  Records from recent providers to be requested if not available in Chart Review or Care Everywhere.  Recommendations for health maintenance, diet, and exercise provided.  Labs today: CBC, CMP, A1c, TSH HM Recommendations: Flu, COVID CPE due: In next few weeks

## 2021-02-15 NOTE — Assessment & Plan Note (Addendum)
New onset of intermittent palpitations noted with no associated symptoms and no cardiac history. She did have significant blood loss 1 year ago with delivery of her child and has not had repeat hemoglobin evaluation therefore we will obtain this today.  We will also obtain additional labs including thyroid for further evaluation. Symptoms do not appear to be associated with anxiety. There are no alarm symptoms present today heart rate is regular and within normal limits. Given her age and new onset of symptoms we will send referral to cardiology for further evaluation and possible cardiac monitoring. In office EKG deferred today given no symptoms present at this time. We will plan to follow-up based on labs and cardiology evaluation. She will be due for CPE in the near future. She is aware of alarm symptoms that would require immediate evaluation in the emergency room or follow-up with me.

## 2021-02-20 ENCOUNTER — Other Ambulatory Visit (HOSPITAL_BASED_OUTPATIENT_CLINIC_OR_DEPARTMENT_OTHER): Payer: Self-pay | Admitting: Nurse Practitioner

## 2021-02-20 ENCOUNTER — Other Ambulatory Visit (HOSPITAL_COMMUNITY): Payer: Self-pay

## 2021-02-20 DIAGNOSIS — E559 Vitamin D deficiency, unspecified: Secondary | ICD-10-CM

## 2021-02-20 MED ORDER — VITAMIN D (ERGOCALCIFEROL) 1.25 MG (50000 UNIT) PO CAPS
50000.0000 [IU] | ORAL_CAPSULE | ORAL | 0 refills | Status: AC
  Filled 2021-02-20: qty 4, 28d supply, fill #0

## 2021-02-21 ENCOUNTER — Other Ambulatory Visit (HOSPITAL_COMMUNITY): Payer: Self-pay

## 2021-02-22 ENCOUNTER — Other Ambulatory Visit: Payer: Self-pay

## 2021-02-22 ENCOUNTER — Encounter: Payer: Self-pay | Admitting: Internal Medicine

## 2021-02-22 ENCOUNTER — Ambulatory Visit (INDEPENDENT_AMBULATORY_CARE_PROVIDER_SITE_OTHER): Payer: Managed Care, Other (non HMO) | Admitting: Internal Medicine

## 2021-02-22 VITALS — BP 126/80 | HR 63 | Ht 59.0 in | Wt 144.2 lb

## 2021-02-22 DIAGNOSIS — R002 Palpitations: Secondary | ICD-10-CM

## 2021-02-22 NOTE — Patient Instructions (Signed)
Medication Instructions:  Your physician recommends that you continue on your current medications as directed. Please refer to the Current Medication list given to you today.  Follow-Up: At [facility], you and your health needs are our priority.  As part of our continuing mission to provide you with exceptional heart care, we have created designated Provider Care Teams.  These Care Teams include your primary Cardiologist (physician) and Advanced Practice Providers (APPs -  Physician Assistants and Nurse Practitioners) who all work together to provide you with the care you need, when you need it.  We recommend signing up for the patient portal called "MyChart".  Sign up information is provided on this After Visit Summary.  MyChart is used to connect with patients for Virtual Visits (Telemedicine).  Patients are able to view lab/test results, encounter notes, upcoming appointments, etc.  Non-urgent messages can be sent to your provider as well.   To learn more about what you can do with MyChart, go to [email or web address].    Your next appointment:   AS NEEDED with Dr. Branch    

## 2021-02-22 NOTE — Progress Notes (Signed)
Cardiology Office Note:    Date:  02/22/2021   ID:  Patricia Montgomery, DOB [birth year], MRN [id number]  PCP:  Tollie Eth, NP   [facility] Providers Cardiologist:  None     Referring MD: Tollie Eth, NP   No chief complaint on file. Palpitations  History of Present Illness:    Patricia Montgomery is a 30 y.o. female with a hx of  ,G1P1 here for palpitations  She feels her heart beating slowly. She takes care of a baby during the day. She denies LH, dizziness, syncope,  etoh, family hx of SCD. She has no known structural heart dx. She drinks coffee once to twice per week. Mother has high cholesterol. Her sister is healthy. For her pregnancy, she lost some significant blood during her c-section. She had no gestational diabetes, no pre-eclampsia. She's taking Vitamin D. Her hgb is normal. Her thyroid is normal. No electrolyte abnormalities. No exertional dyspnea, no chest pain. No swelling her legs. No orthopnea or PND.    Past Medical History:  Diagnosis Date   Chlamydia contact, treated    7/11, 10/11    Past Surgical History:  Procedure Laterality Date   CESAREAN SECTION N/A 02/29/2020   Procedure: CESAREAN SECTION;  Surgeon: Toy Baker, DO;  Location: [facility] ORS;  Service: Obstetrics;  Laterality: N/A;    Current Medications: No outpatient medications have been marked as taking for the 02/22/21 encounter (Appointment) with Maisie Fus, MD.     Allergies:   Patient has no allergy information on record.   Social History   Socioeconomic History   Marital status: Married    Spouse name: Not on file   Number of children: Not on file   Years of education: Not on file   Highest education level: Not on file  Occupational History   Not on file  Tobacco Use   Smoking status: Never   Smokeless tobacco: Never  Vaping Use   Vaping Use: Never used  Substance and Sexual Activity   Alcohol use: No    Alcohol/week: 0.0 standard drinks   Drug use: No    Sexual activity: Yes    Partners: Male    Birth control/protection: None  Other Topics Concern   Not on file  Social History Narrative   Not on file   Social Determinants of Health   Financial Resource Strain: Not on file  Food Insecurity: Not on file  Transportation Needs: Not on file  Physical Activity: Not on file  Stress: Not on file  Social Connections: Not on file     Family History: The patient's family history includes Diabetes in her maternal grandmother; Heart Problems in her maternal grandmother; Hypertension in her maternal grandmother.  ROS:   Please see the history of present illness.     All other systems reviewed and are negative.  EKGs/Labs/Other Studies Reviewed:    The following studies were reviewed today:   EKG:  EKG is  ordered today.  The ekg ordered today demonstrates   NSR, sinus arrhythmia, QTc  Recent Labs: 02/14/2021: ALT 14; BUN 9; Creatinine, Ser 0.82; Hemoglobin 12.7; Platelets 295; Potassium 4.4; Sodium 137; TSH 1.430  Recent Lipid Panel No results found for: CHOL, TRIG, HDL, CHOLHDL, VLDL, LDLCALC, LDLDIRECT   Risk Assessment/Calculations:           Physical Exam:    VS:  LMP 02/10/2021     Wt Readings from Last 3 Encounters:  02/14/21 146 lb (66.2  kg)  02/28/20 170 lb 9.6 oz (77.4 kg)  10/18/19 151 lb 12.8 oz (68.9 kg)     GEN:  Well nourished, well developed in no acute distress HEENT: Normal NECK: No JVD; LYMPHATICS: No lymphadenopathy CARDIAC: RRR, no murmurs, rubs, gallops RESPIRATORY:  Clear to auscultation without rales, wheezing or rhonchi  ABDOMEN: Soft, non-tender, non-distended MUSCULOSKELETAL:  No edema; No deformity  SKIN: Warm and dry NEUROLOGIC:  Alert and oriented x 3 PSYCHIATRIC:  Normal affect   ASSESSMENT:    #Palpitations: She notes some irregular heart beats. She does not have high risk features including syncope c/f arrhythmia , family hx of SCD, or abnormalities on her EKG. We discussed  that if she develops LH, dizziness, or syncope to let us know. Otherwise, will leave follow up on an as needed basis.    PLAN:    In order of problems listed above:  Follow up PRN         Medication Adjustments/Labs and Tests Ordered: Current medicines are reviewed at length with the patient today.  Concerns regarding medicines are outlined above.    Signed, Maisie Fus, MD  02/22/2021 8:23 AM    [facility]

## 2021-03-27 ENCOUNTER — Other Ambulatory Visit: Payer: Self-pay

## 2021-03-27 ENCOUNTER — Ambulatory Visit (HOSPITAL_BASED_OUTPATIENT_CLINIC_OR_DEPARTMENT_OTHER): Payer: Managed Care, Other (non HMO) | Admitting: Nurse Practitioner

## 2021-03-27 ENCOUNTER — Encounter (HOSPITAL_BASED_OUTPATIENT_CLINIC_OR_DEPARTMENT_OTHER): Payer: Self-pay | Admitting: Nurse Practitioner

## 2021-03-27 VITALS — BP 117/72 | HR 96 | Ht 59.0 in | Wt 147.8 lb

## 2021-03-27 DIAGNOSIS — L299 Pruritus, unspecified: Secondary | ICD-10-CM | POA: Insufficient documentation

## 2021-03-27 MED ORDER — PREDNISONE 20 MG PO TABS: 20.0000 mg | ORAL_TABLET | Freq: Every day | ORAL | 0 refills | Status: DC

## 2021-03-27 MED ORDER — HYDROXYZINE HCL 25 MG PO TABS: 25.0000 mg | ORAL_TABLET | Freq: Four times a day (QID) | ORAL | 11 refills | Status: DC | PRN

## 2021-03-27 NOTE — Progress Notes (Signed)
Acute Office Visit  Subjective:    Patient ID: Patricia Montgomery, female    DOB: [birth year], 30 y.o.   MRN: [id number]  Chief Complaint  Patient presents with   Acute Visit    Patient presents today with itchy skin all over body x 2 weeks. No changes in lotion, laundry detergent. Patient stated she hasn't tried anything OTC.    HPI Patient is in today for generalized pruritis all over that started about 2 weeks ago. She has no rash, no erythema, no dryness or flaking of the skin, no changes to texture of the skin. No fever, chills, body aches, sore throat, recent illness. No change in soap, lotion, detergent, shampoo, medications, foods. No history of the same. Only symptom is intense itching that feels worse when hot or immediately out of the shower. She does report she took a shower at her sisters house last week and did not have the sensation at that time. The itching does not end when drying off, but lasts all day.  [place] end[place] the city [place] and showers with city water. She has been in the same home with the same water for years now with no changes.  Her husband and son do not have symptoms.  She does endorse showering with hot water and most often takes showers twice a day.   Review of Systems All review of systems negative except what is listed in the HPI    Objective:    Physical Exam Vitals and nursing note reviewed.  Constitutional:      General: She is not in acute distress.    Appearance: Normal appearance. She is not ill-appearing or diaphoretic.  HENT:     Head: Normocephalic.  Eyes:     Extraocular Movements: Extraocular movements intact.     Conjunctiva/sclera: Conjunctivae normal.     Pupils: Pupils are equal, round, and reactive to light.  Cardiovascular:     Rate and Rhythm: Normal rate and regular rhythm.     Pulses: Normal pulses.     Heart sounds: Normal heart sounds.  Pulmonary:     Effort: Pulmonary effort is normal.     Breath sounds:  Normal breath sounds.  Abdominal:     General: Bowel sounds are normal.     Palpations: Abdomen is soft.  Musculoskeletal:        General: No swelling or tenderness. Normal range of motion.     Cervical back: Normal range of motion.     Right lower leg: No edema.     Left lower leg: No edema.  Lymphadenopathy:     Cervical: No cervical adenopathy.  Skin:    General: Skin is warm and dry.     Capillary Refill: Capillary refill takes less than 2 seconds.     Coloration: Skin is not jaundiced.     Findings: No erythema, lesion or rash.  Neurological:     General: No focal deficit present.     Mental Status: She is alert and oriented to person, place, and time.     Sensory: No sensory deficit.     Motor: No weakness.  Psychiatric:        Mood and Affect: Mood normal.        Behavior: Behavior normal.        Thought Content: Thought content normal.        Judgment: Judgment normal.    BP 117/72    Pulse 96    Ht 4\' 11"  (  1.499 m)    Wt 147 lb 12.8 oz (67 kg)    SpO2 98%    BMI 29.85 kg/m  Wt Readings from Last 3 Encounters:  03/27/21 147 lb 12.8 oz (67 kg)  02/22/21 144 lb 3.2 oz (65.4 kg)  02/14/21 146 lb (66.2 kg)      Assessment & Plan:   Problem List Items Addressed This Visit     Pruritus - Primary    Sudden onset of intense pruritus approximately 2 weeks ago with no additional symptoms.  No change in symptoms since they have started. Physical exam is benign today benign with no revealing cause.  Skin is soft and moist with no evidence of erythema, rash, dryness, excoriation.   Discussed with patient that symptoms could possibly be related to a change in the component of city water given that her symptoms are worse after showering. Reviewed most recent labs which reveal normal thyroid, normal kidney, and normal liver function. She is not currently on any medication and does not use any type of recreational drugs that could contribute to this. At this time etiology is  unclear.  We will try short burst of low-dose steroids for 5 days and provided patient with hydroxyzine to use for pruritus.  Recommend taking cool showers and limiting showers to every other day if at all possible.  Recommend hydration of skin with emollient without scent or dye.  Also recommend increasing moisture in the house with humidifier. Discussed with patient if symptoms fail to improve with treatment we will repeat lab work and send dermatology referral for further evaluation.      Relevant Medications   predniSONE (DELTASONE) 20 MG tablet   hydrOXYzine (ATARAX) 25 MG tablet     Meds ordered this encounter  Medications   predniSONE (DELTASONE) 20 MG tablet    Sig: Take 1 tablet (20 mg total) by mouth daily with breakfast.    Dispense:  5 tablet    Refill:  0   hydrOXYzine (ATARAX) 25 MG tablet    Sig: Take 1 tablet (25 mg total) by mouth every 6 (six) hours as needed for itching. May take 2 doses (50mg ) at bedtime.    Dispense:  60 tablet    Refill:  11     , NP

## 2021-03-27 NOTE — Assessment & Plan Note (Signed)
Sudden onset of intense pruritus approximately 2 weeks ago with no additional symptoms.  No change in symptoms since they have started. Physical exam is benign today benign with no revealing cause.  Skin is soft and moist with no evidence of erythema, rash, dryness, excoriation.   Discussed with patient that symptoms could possibly be related to a change in the component of city water given that her symptoms are worse after showering. Reviewed most recent labs which reveal normal thyroid, normal kidney, and normal liver function. She is not currently on any medication and does not use any type of recreational drugs that could contribute to this. At this time etiology is unclear.  We will try short burst of low-dose steroids for 5 days and provided patient with hydroxyzine to use for pruritus.  Recommend taking cool showers and limiting showers to every other day if at all possible.  Recommend hydration of skin with emollient without scent or dye.  Also recommend increasing moisture in the house with humidifier. Discussed with patient if symptoms fail to improve with treatment we will repeat lab work and send dermatology referral for further evaluation.

## 2021-03-27 NOTE — Patient Instructions (Signed)
Recommendations from today's visit:  Use an emollient moisturizer twice a day to help keep moisture in Be sure you are drinking plenty of water Do not take super hot showers, use the coolest temperature you can tolerate.  We will try a short burst of steroids and hydroxyzine as needed to help with itching If this doesn't go away or goes away and comes back, we likely need to have you evaluated with dermatology.

## 2021-05-17 ENCOUNTER — Other Ambulatory Visit: Payer: Self-pay

## 2021-05-17 ENCOUNTER — Ambulatory Visit (HOSPITAL_BASED_OUTPATIENT_CLINIC_OR_DEPARTMENT_OTHER): Payer: Managed Care, Other (non HMO)

## 2021-05-17 DIAGNOSIS — E559 Vitamin D deficiency, unspecified: Secondary | ICD-10-CM

## 2021-05-18 LAB — VITAMIN D 25 HYDROXY (VIT D DEFICIENCY, FRACTURES): Vit D, 25-Hydroxy: 18.2 ng/mL — ABNORMAL LOW (ref 30.0–100.0)

## 2021-05-20 ENCOUNTER — Encounter (HOSPITAL_BASED_OUTPATIENT_CLINIC_OR_DEPARTMENT_OTHER): Payer: Self-pay | Admitting: Nurse Practitioner

## 2021-05-21 ENCOUNTER — Ambulatory Visit (HOSPITAL_BASED_OUTPATIENT_CLINIC_OR_DEPARTMENT_OTHER): Payer: Managed Care, Other (non HMO) | Admitting: Nurse Practitioner

## 2021-05-22 ENCOUNTER — Other Ambulatory Visit (HOSPITAL_BASED_OUTPATIENT_CLINIC_OR_DEPARTMENT_OTHER): Payer: Self-pay | Admitting: Nurse Practitioner

## 2021-05-22 DIAGNOSIS — E559 Vitamin D deficiency, unspecified: Secondary | ICD-10-CM

## 2021-05-22 MED ORDER — VITAMIN D (ERGOCALCIFEROL) 1.25 MG (50000 UNIT) PO CAPS: 50000.0000 [IU] | ORAL_CAPSULE | ORAL | 1 refills | Status: AC

## 2021-08-12 ENCOUNTER — Telehealth: Payer: 59 | Admitting: Emergency Medicine

## 2021-08-12 ENCOUNTER — Encounter (HOSPITAL_BASED_OUTPATIENT_CLINIC_OR_DEPARTMENT_OTHER): Payer: Self-pay | Admitting: Nurse Practitioner

## 2021-08-12 DIAGNOSIS — R051 Acute cough: Secondary | ICD-10-CM | POA: Diagnosis not present

## 2021-08-12 MED ORDER — SPACER/AERO-HOLDING CHAMBERS DEVI: 1.0000 | 0 refills | Status: AC | PRN

## 2021-08-12 MED ORDER — ALBUTEROL SULFATE HFA 108 (90 BASE) MCG/ACT IN AERS: 2.0000 | INHALATION_SPRAY | Freq: Four times a day (QID) | RESPIRATORY_TRACT | 0 refills | Status: AC | PRN

## 2021-08-12 NOTE — Progress Notes (Addendum)
We are sorry that you are not feeling well.  Here is how we plan to help! ? ?Based on your presentation I believe you most likely have A cough due to a virus.  This is called viral bronchitis and is best treated by rest, plenty of fluids and control of the cough.  You may use Ibuprofen or Tylenol as directed to help your symptoms.   ?  ?In addition please take A non-prescription cough medication called Mucinex DM: take 2 tablets every 12 hours. I think this will help you more than the tylenol flu or nyquil medicines.  ? ?Also please use saline nasal spray several times a day. Saline is just salt and water and it is not a medicine but it can really help relieve nasal congestion.  ? ?I am also prescribing you an albuterol inhaler with a spacer to use when you have coughing spells or when you feel short of breath. Have the pharmacy teach you how to use the inhaler and spacer.  ? ?If this treatment plan doesn't help you, you should be seen in person.  ? ?From your responses in the eVisit questionnaire you describe inflammation in the upper respiratory tract which is causing a significant cough.  This is commonly called Bronchitis and has four common causes:   ?Allergies ?Viral Infections ?Acid Reflux ?Bacterial Infection ?Allergies, viruses and acid reflux are treated by controlling symptoms or eliminating the cause. An example might be a cough caused by taking certain blood pressure medications. You stop the cough by changing the medication. Another example might be a cough caused by acid reflux. Controlling the reflux helps control the cough. ? ?USE OF BRONCHODILATOR ("RESCUE") INHALERS: ?There is a risk from using your bronchodilator too frequently.  The risk is that over-reliance on a medication which only relaxes the muscles surrounding the breathing tubes can reduce the effectiveness of medications prescribed to reduce swelling and congestion of the tubes themselves.  Although you feel brief relief from the  bronchodilator inhaler, your asthma may actually be worsening with the tubes becoming more swollen and filled with mucus.  This can delay other crucial treatments, such as oral steroid medications. If you need to use a bronchodilator inhaler daily, several times per day, you should discuss this with your provider.  There are probably better treatments that could be used to keep your asthma under control.  ?   ?HOME CARE ?Only take medications as instructed by your medical team. ?Complete the entire course of an antibiotic. ?Drink plenty of fluids and get plenty of rest. ?Avoid close contacts especially the very young and the elderly ?Cover your mouth if you cough or cough into your sleeve. ?Always remember to wash your hands ?A steam or ultrasonic humidifier can help congestion.  ? ?GET HELP RIGHT AWAY IF: ?You develop worsening fever. ?You become short of breath ?You cough up blood. ?Your symptoms persist after you have completed your treatment plan ?MAKE SURE YOU  ?Understand these instructions. ?Will watch your condition. ?Will get help right away if you are not doing well or get worse. ?  ? ?Thank you for choosing an e-visit. ? ?Your e-visit answers were reviewed by a board certified advanced clinical practitioner to complete your personal care plan. Depending upon the condition, your plan could have included both over the counter or prescription medications. ? ?Please review your pharmacy choice. Make sure the pharmacy is open so you can pick up prescription now. If there is a problem, you may contact  your provider through [employer] and have the prescription routed to another pharmacy.  Your safety is important to [place]. If you have drug allergies check your prescription carefully.  ? ?For the next 24 hours you can use MyChart to ask questions about today's visit, request a non-urgent call back, or ask for a work or school excuse. ?You will get an email in the next two days asking about your experience. I  hope that your e-visit has been valuable and will speed your recovery. ? ?I have spent 5 minutes in review of e-visit questionnaire, review and updating patient chart, medical decision making and response to patient.  ? ?Rica Mast, PhD, FNP-BC ?  ?

## 2021-08-14 ENCOUNTER — Other Ambulatory Visit (HOSPITAL_BASED_OUTPATIENT_CLINIC_OR_DEPARTMENT_OTHER): Payer: Self-pay | Admitting: Nurse Practitioner

## 2021-08-14 DIAGNOSIS — B9689 Other specified bacterial agents as the cause of diseases classified elsewhere: Secondary | ICD-10-CM

## 2021-08-14 MED ORDER — BENZONATATE 200 MG PO CAPS: 200.0000 mg | ORAL_CAPSULE | Freq: Three times a day (TID) | ORAL | 0 refills | Status: AC | PRN

## 2021-08-14 MED ORDER — AZITHROMYCIN 250 MG PO TABS: ORAL_TABLET | ORAL | 0 refills | Status: AC

## 2021-09-23 ENCOUNTER — Telehealth: Payer: 59 | Admitting: Family Medicine

## 2021-09-23 DIAGNOSIS — M542 Cervicalgia: Secondary | ICD-10-CM

## 2021-09-23 NOTE — Progress Notes (Signed)
[place]   Inability to move well- neck pain left side with pain shoot up and down back/spine also coming from legs. Needs help moving- advised ED for in person care given the symptoms and presentation.  Patient acknowledged agreement and understanding of the plan.

## 2021-09-23 NOTE — Patient Instructions (Signed)
I appreciate the opportunity to provide you with care for your health and wellness.  Follow up: at the closest ED for in person eval of your symptoms.
